# Patient Record
Sex: Female | Born: 1985 | Race: White | Hispanic: No | Marital: Single | Smoking: Never smoker
Health system: Southern US, Community
[De-identification: ages and names within clinical notes are randomized; demographics above are authoritative.]

## PROBLEM LIST (undated history)

## (undated) DIAGNOSIS — F32A Depression, unspecified: Secondary | ICD-10-CM

## (undated) DIAGNOSIS — G43909 Migraine, unspecified, not intractable, without status migrainosus: Secondary | ICD-10-CM

## (undated) DIAGNOSIS — F329 Major depressive disorder, single episode, unspecified: Secondary | ICD-10-CM

## (undated) HISTORY — PX: GALLBLADDER SURGERY: SHX652

## (undated) HISTORY — PX: PLACEMENT OF BREAST IMPLANTS: SHX6334

---
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
## 2008-11-06 ENCOUNTER — Encounter: Payer: Self-pay | Admitting: Endocrinology

## 2008-11-07 ENCOUNTER — Encounter: Payer: Self-pay | Admitting: Endocrinology

## 2008-12-15 ENCOUNTER — Ambulatory Visit: Payer: Self-pay | Admitting: Endocrinology

## 2008-12-15 DIAGNOSIS — R002 Palpitations: Secondary | ICD-10-CM | POA: Insufficient documentation

## 2010-03-31 ENCOUNTER — Ambulatory Visit (HOSPITAL_COMMUNITY): Payer: Self-pay | Admitting: Psychiatry

## 2010-03-31 ENCOUNTER — Ambulatory Visit (HOSPITAL_COMMUNITY): Admission: RE | Admit: 2010-03-31 | Discharge: 2010-03-31 | Payer: Self-pay | Admitting: Psychiatry

## 2010-04-04 ENCOUNTER — Other Ambulatory Visit (HOSPITAL_COMMUNITY): Admission: RE | Admit: 2010-04-04 | Discharge: 2010-04-20 | Payer: Self-pay | Admitting: Psychiatry

## 2010-04-15 ENCOUNTER — Ambulatory Visit: Payer: Self-pay | Admitting: Psychiatry

## 2010-04-22 ENCOUNTER — Ambulatory Visit (HOSPITAL_COMMUNITY): Payer: Self-pay | Admitting: Psychiatry

## 2010-04-29 ENCOUNTER — Ambulatory Visit (HOSPITAL_COMMUNITY): Payer: Self-pay | Admitting: Psychiatry

## 2010-05-06 ENCOUNTER — Ambulatory Visit (HOSPITAL_COMMUNITY): Payer: Self-pay | Admitting: Psychiatry

## 2010-05-20 ENCOUNTER — Ambulatory Visit (HOSPITAL_COMMUNITY): Payer: Self-pay | Admitting: Psychiatry

## 2010-05-27 ENCOUNTER — Ambulatory Visit (HOSPITAL_COMMUNITY): Payer: Self-pay | Admitting: Psychiatry

## 2010-06-03 ENCOUNTER — Ambulatory Visit (HOSPITAL_COMMUNITY): Payer: Self-pay | Admitting: Psychiatry

## 2010-06-10 ENCOUNTER — Ambulatory Visit (HOSPITAL_COMMUNITY): Payer: Self-pay | Admitting: Psychiatry

## 2010-06-24 ENCOUNTER — Ambulatory Visit (HOSPITAL_COMMUNITY): Payer: Self-pay | Admitting: Psychiatry

## 2015-01-25 ENCOUNTER — Encounter: Payer: Self-pay | Admitting: Emergency Medicine

## 2015-01-25 ENCOUNTER — Emergency Department
Admission: EM | Admit: 2015-01-25 | Discharge: 2015-01-25 | Disposition: A | Discharge status: Home / Self Care | Payer: BLUE CROSS/BLUE SHIELD | Attending: Emergency Medicine | Admitting: Emergency Medicine

## 2015-01-25 DIAGNOSIS — G43809 Other migraine, not intractable, without status migrainosus: Secondary | ICD-10-CM

## 2015-01-25 DIAGNOSIS — G43909 Migraine, unspecified, not intractable, without status migrainosus: Secondary | ICD-10-CM | POA: Diagnosis present

## 2015-01-25 HISTORY — DX: Depression, unspecified: F32.A

## 2015-01-25 HISTORY — DX: Migraine, unspecified, not intractable, without status migrainosus: G43.909

## 2015-01-25 HISTORY — DX: Major depressive disorder, single episode, unspecified: F32.9

## 2015-01-25 MED ORDER — KETOROLAC TROMETHAMINE 30 MG/ML IJ SOLN
INTRAMUSCULAR | Status: AC
  Administered 2015-01-25: 30 mg via INTRAVENOUS
  Filled 2015-01-25: qty 1

## 2015-01-25 MED ORDER — KETOROLAC TROMETHAMINE 30 MG/ML IJ SOLN
30.0000 mg | Freq: Once | INTRAMUSCULAR | Status: AC
  Administered 2015-01-25: 30 mg via INTRAVENOUS

## 2015-01-25 MED ORDER — PROMETHAZINE HCL 25 MG PO TABS: 25.0000 mg | ORAL_TABLET | Freq: Once | ORAL | Status: DC

## 2015-01-25 MED ORDER — METOCLOPRAMIDE HCL 5 MG/ML IJ SOLN: 20.0000 mg | Freq: Once | INTRAVENOUS | Status: AC

## 2015-01-25 MED ORDER — METOCLOPRAMIDE HCL 5 MG/ML IJ SOLN
INTRAMUSCULAR | Status: AC
  Administered 2015-01-25: 20 mg
  Filled 2015-01-25: qty 2

## 2015-01-25 MED ORDER — SODIUM CHLORIDE 0.9 % IV SOLN
1000.0000 mL | Freq: Once | INTRAVENOUS | Status: AC
  Administered 2015-01-25: 1000 mL via INTRAVENOUS

## 2015-01-25 MED ORDER — DIPHENHYDRAMINE HCL 50 MG/ML IJ SOLN
25.0000 mg | Freq: Once | INTRAMUSCULAR | Status: AC
  Administered 2015-01-25: 50 mg via INTRAVENOUS

## 2015-01-25 MED ORDER — PROMETHAZINE HCL 25 MG RE SUPP: 25.0000 mg | Freq: Four times a day (QID) | RECTAL | Status: DC | PRN

## 2015-01-25 MED ORDER — DIPHENHYDRAMINE HCL 50 MG/ML IJ SOLN
INTRAMUSCULAR | Status: AC
  Administered 2015-01-25: 50 mg via INTRAVENOUS
  Filled 2015-01-25: qty 1

## 2015-01-25 MED ORDER — BUTALBITAL-APAP-CAFFEINE 50-325-40 MG PO TABS: 1.0000 | ORAL_TABLET | Freq: Four times a day (QID) | ORAL | Status: AC | PRN

## 2015-01-25 NOTE — ED Provider Notes (Signed)
[facility] Emergency Department Provider Note  ____________________________________________  Time seen: 5 PM  I have reviewed the triage vital signs and the nursing notes.   HISTORY  Chief Complaint Migraine      HPI Lynn Alexander is a 29 y.o. female history of migraine headaches who presents with a migraine today. She reports this one has gone on longer than typical as it started 3 days ago. It is typical of her usual migraines with throbbing in her forehead and this one is severe per patient. She has no neuro deficits. She has no fevers chills or neck pain. She has taken Tylenol and other over-the-counter medications without relief.     Past Medical History  Diagnosis Date  . Migraines   . Depression     Patient Active Problem List   Diagnosis Date Noted  . PALPITATIONS 12/15/2008    No past surgical history on file.  No current outpatient prescriptions on file.  Allergies Review of patient's allergies indicates no known allergies.  No family history on file.  Social History History  Substance Use Topics  . Smoking status: Never Smoker   . Smokeless tobacco: Not on file  . Alcohol Use: No    Review of Systems  Constitutional: Negative for fever. Eyes: Negative for visual changes. ENT: Negative for sore throat Cardiovascular: Negative for chest pain. Respiratory: Negative for shortness of breath. Gastrointestinal: Negative for abdominal pain, vomiting and diarrhea. Genitourinary: Negative for dysuria. Musculoskeletal: Negative for back pain. Skin: Negative for rash. Neurological: Positive for headache   10-point ROS otherwise negative.  ____________________________________________   PHYSICAL EXAM:  VITAL SIGNS: ED Triage Vitals  Enc Vitals Group     BP --      Pulse Rate 01/25/15 1551 128     Resp --      Temp 01/25/15 1551 98.2 F (36.8 C)     Temp Source 01/25/15 1551 Oral     SpO2 01/25/15 1551 95 %   Weight 01/25/15 1551 285 lb (129.275 kg)     Height 01/25/15 1551 5\' 5"  (1.651 m)     Head Cir --      Peak Flow --      Pain Score 01/25/15 1551 6     Pain Loc --      Pain Edu? --      Excl. in GC? --      Constitutional: Alert and oriented. Well appearing and in no distress. Eyes: Conjunctivae are normal. PERRL. ENT   Head: Normocephalic and atraumatic.   Nose: No rhinnorhea.   Mouth/Throat: Mucous membranes are moist. Cardiovascular: Tachycardia, regular rhythm. Normal and symmetric distal pulses are present in all extremities. No murmurs, rubs, or gallops. Respiratory: Normal respiratory effort without tachypnea nor retractions. Breath sounds are clear and equal bilaterally.  Gastrointestinal: Soft and non-tender in all quadrants. No distention. There is no CVA tenderness. Genitourinary: deferred Musculoskeletal: Nontender with normal range of motion in all extremities. No lower extremity tenderness nor edema. Neurologic:  Normal speech and language. No gross focal neurologic deficits are appreciated. Skin:  Skin is warm, dry and intact. No rash noted. Psychiatric: Mood and affect are normal. Patient exhibits appropriate insight and judgment.  ____________________________________________    LABS (pertinent positives/negatives)  Labs Reviewed - No data to display  ____________________________________________   EKG  None  ____________________________________________    RADIOLOGY  None  ____________________________________________   PROCEDURES  Procedure(s) performed: none  Critical Care performed: none  ____________________________________________  INITIAL IMPRESSION / ASSESSMENT AND PLAN / ED COURSE  Pertinent labs & imaging results that were available during my care of the patient were reviewed by me and considered in my medical decision making (see chart for details).  Patient well-appearing, neuro intact. No neck pain, no fever, no  rash. No trauma. We will treat with headache cocktail and IV fluids and reevaluate  ____________________________________________ ----------------------------------------- 6:25 PM on 01/25/2015 -----------------------------------------  Patient reports headache has resolved. Her heart rate is improved to 94 bpm. She feels ready for discharge. She requests Phenergan for nausea that she sometimes develops with headaches  FINAL CLINICAL IMPRESSION(S) / ED DIAGNOSES  Final diagnoses:  Other migraine without status migrainosus, not intractable     Jene Every, MD 01/25/15 1826

## 2015-01-25 NOTE — ED Notes (Signed)
Pt c/o migraine headache since Friday am, states she has hx of migraines and usually is able to mange the pain with OTC meds and her chiropractor, states this time she has not been able to relieve the pain, also having some v/d, and dizziness, denies any visual disturbances

## 2015-01-25 NOTE — Discharge Instructions (Signed)

## 2015-01-25 NOTE — ED Notes (Signed)
Sent in from urgent care with migraine started last pm  Positive n/v

## 2019-09-19 ENCOUNTER — Other Ambulatory Visit: Payer: BLUE CROSS/BLUE SHIELD

## 2019-09-25 ENCOUNTER — Inpatient Hospital Stay
Admission: EM | Admit: 2019-09-25 | Discharge: 2019-09-26 | DRG: 177 | Disposition: A | Discharge status: Other Acute Inpatient Hospital | Payer: HRSA Program | Attending: Internal Medicine | Admitting: Internal Medicine

## 2019-09-25 ENCOUNTER — Emergency Department: Payer: HRSA Program

## 2019-09-25 ENCOUNTER — Other Ambulatory Visit: Payer: Self-pay

## 2019-09-25 DIAGNOSIS — J9600 Acute respiratory failure, unspecified whether with hypoxia or hypercapnia: Secondary | ICD-10-CM

## 2019-09-25 DIAGNOSIS — Z79899 Other long term (current) drug therapy: Secondary | ICD-10-CM | POA: Diagnosis not present

## 2019-09-25 DIAGNOSIS — J9601 Acute respiratory failure with hypoxia: Secondary | ICD-10-CM | POA: Diagnosis present

## 2019-09-25 DIAGNOSIS — Z791 Long term (current) use of non-steroidal anti-inflammatories (NSAID): Secondary | ICD-10-CM

## 2019-09-25 DIAGNOSIS — U071 COVID-19: Secondary | ICD-10-CM | POA: Diagnosis present

## 2019-09-25 DIAGNOSIS — F329 Major depressive disorder, single episode, unspecified: Secondary | ICD-10-CM | POA: Diagnosis present

## 2019-09-25 DIAGNOSIS — F32A Depression, unspecified: Secondary | ICD-10-CM | POA: Diagnosis present

## 2019-09-25 DIAGNOSIS — J1282 Pneumonia due to coronavirus disease 2019: Secondary | ICD-10-CM | POA: Diagnosis present

## 2019-09-25 LAB — CBC WITH DIFFERENTIAL/PLATELET
Abs Immature Granulocytes: 0.04 10*3/uL (ref 0.00–0.07)
Basophils Absolute: 0 10*3/uL (ref 0.0–0.1)
Basophils Relative: 0 %
Eosinophils Absolute: 0 10*3/uL (ref 0.0–0.5)
Eosinophils Relative: 0 %
HCT: 41.5 % (ref 36.0–46.0)
Hemoglobin: 13.8 g/dL (ref 12.0–15.0)
Immature Granulocytes: 1 %
Lymphocytes Relative: 14 %
Lymphs Abs: 0.7 10*3/uL (ref 0.7–4.0)
MCH: 30.7 pg (ref 26.0–34.0)
MCHC: 33.3 g/dL (ref 30.0–36.0)
MCV: 92.4 fL (ref 80.0–100.0)
Monocytes Absolute: 0.2 10*3/uL (ref 0.1–1.0)
Monocytes Relative: 4 %
Neutro Abs: 4.4 10*3/uL (ref 1.7–7.7)
Neutrophils Relative %: 81 %
Platelets: 158 10*3/uL (ref 150–400)
RBC: 4.49 MIL/uL (ref 3.87–5.11)
RDW: 12.1 % (ref 11.5–15.5)
WBC: 5.4 10*3/uL (ref 4.0–10.5)
nRBC: 0 % (ref 0.0–0.2)

## 2019-09-25 LAB — RESPIRATORY PANEL BY RT PCR (FLU A&B, COVID)
Influenza A by PCR: NEGATIVE
Influenza B by PCR: NEGATIVE
SARS Coronavirus 2 by RT PCR: POSITIVE — AB

## 2019-09-25 LAB — LACTIC ACID, PLASMA: Lactic Acid, Venous: 1 mmol/L (ref 0.5–1.9)

## 2019-09-25 LAB — COMPREHENSIVE METABOLIC PANEL
ALT: 106 U/L — ABNORMAL HIGH (ref 0–44)
AST: 96 U/L — ABNORMAL HIGH (ref 15–41)
Albumin: 3.5 g/dL (ref 3.5–5.0)
Alkaline Phosphatase: 65 U/L (ref 38–126)
Anion gap: 11 (ref 5–15)
BUN: 10 mg/dL (ref 6–20)
CO2: 25 mmol/L (ref 22–32)
Calcium: 8.5 mg/dL — ABNORMAL LOW (ref 8.9–10.3)
Chloride: 102 mmol/L (ref 98–111)
Creatinine, Ser: 0.73 mg/dL (ref 0.44–1.00)
GFR calc Af Amer: >60 mL/min (ref >60)
GFR calc non Af Amer: >60 mL/min (ref >60)
Glucose, Bld: 119 mg/dL — ABNORMAL HIGH (ref 70–99)
Potassium: 3.7 mmol/L (ref 3.5–5.1)
Sodium: 138 mmol/L (ref 135–145)
Total Bilirubin: 0.8 mg/dL (ref 0.3–1.2)
Total Protein: 7.3 g/dL (ref 6.5–8.1)

## 2019-09-25 LAB — FIBRINOGEN: Fibrinogen: 687 mg/dL — ABNORMAL HIGH (ref 210–475)

## 2019-09-25 LAB — FERRITIN: Ferritin: 338 ng/mL — ABNORMAL HIGH (ref 11–307)

## 2019-09-25 LAB — TRIGLYCERIDES: Triglycerides: 107 mg/dL (ref <150)

## 2019-09-25 LAB — POC SARS CORONAVIRUS 2 AG: SARS Coronavirus 2 Ag: NEGATIVE

## 2019-09-25 LAB — LACTATE DEHYDROGENASE: LDH: 361 U/L — ABNORMAL HIGH (ref 98–192)

## 2019-09-25 LAB — FIBRIN DERIVATIVES D-DIMER (ARMC ONLY): Fibrin derivatives D-dimer (ARMC): 906.98 ng/mL (FEU) — ABNORMAL HIGH (ref 0.00–499.00)

## 2019-09-25 LAB — C-REACTIVE PROTEIN: CRP: 7.1 mg/dL — ABNORMAL HIGH (ref <1.0)

## 2019-09-25 LAB — PROCALCITONIN: Procalcitonin: <0.1 ng/mL

## 2019-09-25 MED ORDER — SODIUM CHLORIDE 0.9 % IV SOLN
100.0000 mg | Freq: Every day | INTRAVENOUS | Status: DC
  Filled 2019-09-25: qty 20

## 2019-09-25 MED ORDER — SODIUM CHLORIDE 0.9 % IV BOLUS
1000.0000 mL | Freq: Once | INTRAVENOUS | Status: AC
  Administered 2019-09-25: 1000 mL via INTRAVENOUS

## 2019-09-25 MED ORDER — DEXAMETHASONE SODIUM PHOSPHATE 10 MG/ML IJ SOLN
10.0000 mg | Freq: Once | INTRAMUSCULAR | Status: AC
  Administered 2019-09-25: 10 mg via INTRAVENOUS
  Filled 2019-09-25: qty 1

## 2019-09-25 MED ORDER — ACETAMINOPHEN 500 MG PO TABS
1000.0000 mg | ORAL_TABLET | Freq: Once | ORAL | Status: AC
  Administered 2019-09-25: 21:00:00 1000 mg via ORAL
  Filled 2019-09-25: qty 2

## 2019-09-25 MED ORDER — SODIUM CHLORIDE 0.9 % IV SOLN
200.0000 mg | Freq: Once | INTRAVENOUS | Status: AC
  Administered 2019-09-25: 200 mg via INTRAVENOUS
  Filled 2019-09-25: qty 200

## 2019-09-25 NOTE — ED Notes (Signed)
Pt able to ambulate to commode with assistance. Pt returned to bed and resting comfortably.

## 2019-09-25 NOTE — ED Triage Notes (Signed)
Pt arrives via EMS from home after testing positive for covid on last Friday- pt had a fever pg 103.5 with taking acetaminophen and ibruprofen alternating- pt RA sats 89%

## 2019-09-25 NOTE — Consult Note (Signed)
Remdesivir - Pharmacy Brief Note   O:  ALT: 106 CXR: Multifocal pneumonia with greatest degree of airspace opacity in the left upper lobe and right base regions. Cardiac silhouette normal. No adenopathy. SpO2: 88% on 4L   A/P:  Remdesivir 200 mg IVPB once followed by 100 mg IVPB daily x 4 days.   Bettey Costa, PharmD Clinical Pharmacist 09/25/2019 6:42 PM

## 2019-09-25 NOTE — Discharge Summary (Signed)
Patient admitted with COVID-19 pneumonia and acute respiratory failure to [facility].  We will transfer to [facility] due to worsening oxygen requirement.  Patient will require dedicated Covid team at this point.  She is young previously healthy.  Please refer to full H&P as dictated with orders.

## 2019-09-25 NOTE — H&P (Signed)
History and Physical   Lynn Alexander BWL:[id number] DOB: Oct 14, 1985 DOA: 09/25/2019  Referring MD/NP/PA: Dr. Scotty Court  PCP: Patsy Lager Ilona Sorrel, PA-C   Outpatient Specialists: None  Patient coming from: Home  Chief Complaint: Shortness of breath  HPI: Lynn Alexander is a 34 y.o. female with medical history significant of morbid obesity, migraine headaches, who was diagnosed with COVID-19 about 6 days ago.  Patient has been having worsening body aches fever chills and malaise.  She also feels some fatigue.  Not sure of how she contracted it.  She came in today due to worsening symptoms and failing outpatient management.  She is having cough and feeling tight in her chest.  Patient noted to be hypoxic on room air.  Chest x-ray showed bilateral pneumonia.  She is being admitted with COVID-19 pneumonia with oxygen requirement.  Patient currently requiring up to 6 L of oxygen.  She is still borderline hypoxic and might require high flow oxygen or OptiFlow..  ED Course: Temperature 103 blood pressure 147/79 pulse 125 respirate of 33 oxygen sat 88% on room air.  Chemistry overall stable except for AST of 96 and ALT of 106.  LDH 361 triglyceride 107 ferritin 338.  CRP of 7.1 lactic acid 1.0 and procalcitonin is negative.  CBC is within normal.  COVID-19 is positive.  Fibrin 906 and fibrinogen 687.  Chest x-ray showed bilateral infiltrates consistent with Covid pneumonia.  Patient initiated on remdesivir, Actemra, antibiotics and oxygen.  She will be transferred to [facility] for treatment.   Review of Systems: As per HPI otherwise 10 point review of systems negative.    Past Medical History:  Diagnosis Date  . Depression   . Migraines     History reviewed. No pertinent surgical history.   reports that she has never smoked. She has never used smokeless tobacco. She reports that she does not drink alcohol or use drugs.  No Known Allergies  No family history on file.   Prior  to Admission medications   Medication Sig Start Date End Date Taking? Authorizing Provider  acetaminophen (TYLENOL) 500 MG tablet Take 500-1,000 mg by mouth every 6 (six) hours as needed for mild pain or fever.   Yes [provider]  ascorbic acid (VITAMIN C) 500 MG tablet Take 500 mg by mouth daily.   Yes [provider]  cholecalciferol (VITAMIN D3) 25 MCG (1000 UNIT) tablet Take 1,000 Units by mouth daily.   Yes [provider]  escitalopram (LEXAPRO) 10 MG tablet Take 10 mg by mouth at bedtime.   Yes [provider]  ibuprofen (ADVIL) 200 MG tablet Take 200-400 mg by mouth every 6 (six) hours as needed for fever or mild pain.   Yes [provider]  mirtazapine (REMERON) 15 MG tablet Take 7.5 mg by mouth See admin instructions. Take  tablet (7.5mg ) by mouth at bedtime and repeat dose if needed overnight   Yes [provider]  ondansetron (ZOFRAN) 8 MG tablet Take 8 mg by mouth every 8 (eight) hours as needed for nausea or vomiting.   Yes [provider]  vitamin B-12 (CYANOCOBALAMIN) 1000 MCG tablet Take 1,000 mcg by mouth daily.   Yes [provider]  zinc sulfate 220 (50 Zn) MG capsule Take 220 mg by mouth daily.   Yes [provider]    Physical Exam: Vitals:   09/25/19 1800 09/25/19 1845 09/25/19 1856 09/25/19 2017  BP: (!) 147/79   135/72  Pulse: (!) 110 (!)  111 (!) 108 (!) 110  Resp: (!) 31   (!) 25  Temp:    (!) 103 F (39.4 C)  TempSrc:    Oral  SpO2: 93% (!) 88% 92% 92%  Weight:      Height:          Constitutional: Anxious, morbidly obese in mild distress Vitals:   09/25/19 1800 09/25/19 1845 09/25/19 1856 09/25/19 2017  BP: (!) 147/79   135/72  Pulse: (!) 110 (!) 111 (!) 108 (!) 110  Resp: (!) 31   (!) 25  Temp:    (!) 103 F (39.4 C)  TempSrc:    Oral  SpO2: 93% (!) 88% 92% 92%  Weight:      Height:       Eyes: PERRL, lids and conjunctivae normal ENMT: Mucous membranes are  moist. Posterior pharynx clear of any exudate or lesions.Normal dentition.  Neck: normal, supple, no masses, no thyromegaly Respiratory: Decreased air entry bilaterally, mild expiratory wheezing, bilateral rhonchi and rails, use of extra muscle respiration.  Cardiovascular: Sinus tachycardia, no murmurs / rubs / gallops. No extremity edema. 2+ pedal pulses. No carotid bruits.  Abdomen: no tenderness, no masses palpated. No hepatosplenomegaly. Bowel sounds positive.  Musculoskeletal: no clubbing / cyanosis. No joint deformity upper and lower extremities. Good ROM, no contractures. Normal muscle tone.  Skin: no rashes, lesions, ulcers. No induration Neurologic: CN 2-12 grossly intact. Sensation intact, DTR normal. Strength 5/5 in all 4.  Psychiatric: Normal judgment and insight. Alert and oriented x 3. Normal mood.     Labs on Admission: I have personally reviewed following labs and imaging studies  CBC: Recent Labs  Lab 09/25/19 1620  WBC 5.4  NEUTROABS 4.4  HGB 13.8  HCT 41.5  MCV 92.4  PLT 158   Basic Metabolic Panel: Recent Labs  Lab 09/25/19 1620  NA 138  K 3.7  CL 102  CO2 25  GLUCOSE 119*  BUN 10  CREATININE 0.73  CALCIUM 8.5*   GFR: Estimated Creatinine Clearance: 143.4 mL/min (by C-G formula based on SCr of 0.73 mg/dL). Liver Function Tests: Recent Labs  Lab 09/25/19 1620  AST 96*  ALT 106*  ALKPHOS 65  BILITOT 0.8  PROT 7.3  ALBUMIN 3.5   No results for input(s): LIPASE, AMYLASE in the last 168 hours. No results for input(s): AMMONIA in the last 168 hours. Coagulation Profile: No results for input(s): INR, PROTIME in the last 168 hours. Cardiac Enzymes: No results for input(s): CKTOTAL, CKMB, CKMBINDEX, TROPONINI in the last 168 hours. BNP (last 3 results) No results for input(s): PROBNP in the last 8760 hours. HbA1C: No results for input(s): HGBA1C in the last 72 hours. CBG: No results for input(s): GLUCAP in the last 168 hours. Lipid  Profile: Recent Labs    09/25/19 1620  TRIG 107   Thyroid Function Tests: No results for input(s): TSH, T4TOTAL, FREET4, T3FREE, THYROIDAB in the last 72 hours. Anemia Panel: Recent Labs    09/25/19 1620  FERRITIN 338*   Urine analysis: No results found for: COLORURINE, APPEARANCEUR, LABSPEC, PHURINE, GLUCOSEU, HGBUR, BILIRUBINUR, KETONESUR, PROTEINUR, UROBILINOGEN, NITRITE, LEUKOCYTESUR Sepsis Labs: @LABRCNTIP (procalcitonin:4,lacticidven:4) ) Recent Results (from the past 240 hour(s))  Respiratory Panel by RT PCR (Flu A&B, Covid) - Nasopharyngeal Swab     Status: Abnormal   Collection Time: 09/25/19  5:24 PM   Specimen: Nasopharyngeal Swab  Result Value Ref Range Status   SARS Coronavirus 2 by RT PCR POSITIVE (A) NEGATIVE Final  Comment: RESULT CALLED TO, READ BACK BY AND VERIFIED WITH: ARIEL SMITH @1823  09/25/19 MJU (NOTE) SARS-CoV-2 target nucleic acids are DETECTED. SARS-CoV-2 RNA is generally detectable in upper respiratory specimens  during the acute phase of infection. Positive results are indicative of the presence of the identified virus, but do not rule out bacterial infection or co-infection with other pathogens not detected by the test. Clinical correlation with patient history and other diagnostic information is necessary to determine patient infection status. The expected result is Negative. Fact Sheet for Patients:  11/23/19 Fact Sheet for Healthcare Providers: https://www.moore.com/ This test is not yet approved or cleared by the https://www.young.biz/ FDA and  has been authorized for detection and/or diagnosis of SARS-CoV-2 by FDA under an Emergency Use Authorization (EUA).  This EUA will remain in effect (meaning this test can be used) for the  duration of  the COVID-19 declaration under Section 564(b)(1) of the Act, 21 U.S.C. section 360bbb-3(b)(1), unless the authorization is terminated or revoked sooner.     Influenza A by PCR NEGATIVE NEGATIVE Final   Influenza B by PCR NEGATIVE NEGATIVE Final    Comment: (NOTE) The Xpert Xpress SARS-CoV-2/FLU/RSV assay is intended as an aid in  the diagnosis of influenza from Nasopharyngeal swab specimens and  should not be used as a sole basis for treatment. Nasal washings and  aspirates are unacceptable for Xpert Xpress SARS-CoV-2/FLU/RSV  testing. Fact Sheet for Patients: [place] Fact Sheet for Healthcare Providers: https://www.moore.com/ This test is not yet approved or cleared by the https://www.young.biz/ FDA and  has been authorized for detection and/or diagnosis of SARS-CoV-2 by  FDA under an Emergency Use Authorization (EUA). This EUA will remain  in effect (meaning this test can be used) for the duration of the  Covid-19 declaration under Section 564(b)(1) of the Act, 21  U.S.C. section 360bbb-3(b)(1), unless the authorization is  terminated or revoked. Performed at [facility], [address], [place], [place]      Radiological Exams on Admission: DG Chest [facility] 1 View  Result Date: 09/25/2019 CLINICAL DATA:  COVID-19 positive with hypoxia EXAM: PORTABLE CHEST 1 VIEW COMPARISON:  None. FINDINGS: There is airspace opacity consistent with pneumonia in the left upper lobe, right base, and to a lesser extent in the right upper lobe. Heart size and pulmonary vascularity are normal. No adenopathy. No bone lesions. IMPRESSION: Multifocal pneumonia with greatest degree of airspace opacity in the left upper lobe and right base regions. Cardiac silhouette normal. No adenopathy. Electronically Signed   By: 11/23/2019 III M.D.   On: 09/25/2019 16:34    EKG: Independently reviewed. Shows Sinus tachycardia with borderline prolonged QT interval  Assessment/Plan Principal Problem:   Acute respiratory failure due to COVID-19 [facility]) Active Problems:   Depression     #1 Acute  Respiratory failure with Hypoxia: Due to Covid 19 pneumonia.  Patient has high oxygen demand.  Will initiate full treatment for COVID-19 pneumonia.  Titrate oxygen as tolerated.  #2 COVID-19 pneumonia: Initiate remdesivir, IV azithromycin.  Actemra, dexamethasone and Lovenox for prophylaxis.  Follow daily markers.  Transferred to [facility] for full treatment.  #3 morbid obesity: Dietary counseling  #4 depression: Resume home regimen.  Patient appears pleasant at this point  #5 history of migraine headaches: Asymptomatic at the moment.   DVT prophylaxis: Lovenox Code Status: Full code Family Communication: No family at bedside Disposition Plan: To be determined Consults called: None Admission status: Inpatient  Severity of Illness: The  appropriate patient status for this patient is INPATIENT. Inpatient status is judged to be reasonable and necessary in order to provide the required intensity of service to ensure the patient's safety. The patient's presenting symptoms, physical exam findings, and initial radiographic and laboratory data in the context of their chronic comorbidities is felt to place them at high risk for further clinical deterioration. Furthermore, it is not anticipated that the patient will be medically stable for discharge from the hospital within 2 midnights of admission. The following factors support the patient status of inpatient.   " The patient's presenting symptoms include shortness of breath and cough. " The worrisome physical exam findings include fever with decreased air entry. " The initial radiographic and laboratory data are worrisome because of positive COVID-19 with bilateral infiltrates. " The chronic co-morbidities include none.   * I certify that at the point of admission it is my clinical judgment that the patient will require inpatient hospital care spanning beyond 2 midnights from the point of admission due to high intensity of service, high  risk for further deterioration and high frequency of surveillance required.Barbette Merino MD [facility] Pager [phone]  If 7PM-7AM, please contact night-coverage [email or web address] Password [facility]  09/25/2019, 8:29 PM

## 2019-09-25 NOTE — ED Notes (Signed)
Pt states last use of medication was 2 ibuprofen about 1 hour ago

## 2019-09-25 NOTE — ED Notes (Signed)
After pt ambulated to toilet she desatted to 88%- pt O2 upped to 6L [place] and is now 92%

## 2019-09-25 NOTE — ED Notes (Signed)
Bill, RN attempted a second IV and cultures x2 with [place] with no success- Dr Scotty Court aware and okay with only 1 set of blood cultures at this time

## 2019-09-25 NOTE — ED Notes (Signed)
Pt assisted to use bathroom.

## 2019-09-25 NOTE — ED Provider Notes (Signed)
[facility] Emergency Department Provider Note  ____________________________________________  Time seen: Approximately 6:39 PM  I have reviewed the triage vital signs and the nursing notes.   HISTORY  Chief Complaint Shortness of Breath    HPI Lynn Alexander is a 34 y.o. female with a history of migraines who reports having a positive Covid test 6 days ago who complains of worsening body aches fevers chills malaise fatigue and shortness of breath, gradual onset continuous and worsening for the past 9 days.  No aggravating or alleviating factors.  Has a nonproductive cough as well.  Shortness of breath is severe.  Denies chest pain.    Past Medical History:  Diagnosis Date  . Depression   . Migraines      Patient Active Problem List   Diagnosis Date Noted  . PALPITATIONS 12/15/2008     History reviewed. No pertinent surgical history.   Prior to Admission medications   Medication Sig Start Date End Date Taking? Authorizing Provider  acetaminophen (TYLENOL) 500 MG tablet Take 500-1,000 mg by mouth every 6 (six) hours as needed for mild pain or fever.   Yes [provider]  ascorbic acid (VITAMIN C) 500 MG tablet Take 500 mg by mouth daily.   Yes [provider]  cholecalciferol (VITAMIN D3) 25 MCG (1000 UNIT) tablet Take 1,000 Units by mouth daily.   Yes [provider]  escitalopram (LEXAPRO) 10 MG tablet Take 10 mg by mouth at bedtime.   Yes [provider]  ibuprofen (ADVIL) 200 MG tablet Take 200-400 mg by mouth every 6 (six) hours as needed for fever or mild pain.   Yes [provider]  mirtazapine (REMERON) 15 MG tablet Take 7.5 mg by mouth See admin instructions. Take  tablet (7.5mg ) by mouth at bedtime and repeat dose if needed overnight   Yes [provider]  ondansetron (ZOFRAN) 8 MG tablet Take 8 mg by mouth every 8 (eight) hours as needed for nausea or vomiting.   Yes [provider]  vitamin B-12 (CYANOCOBALAMIN) 1000 MCG tablet Take 1,000 mcg by mouth daily.   Yes [provider]  zinc sulfate 220 (50 Zn) MG capsule Take 220 mg by mouth daily.   Yes [provider]     Allergies Patient has no known allergies.   No family history on file.  Social History Social History   Tobacco Use  . Smoking status: Never Smoker  . Smokeless tobacco: Never Used  Substance Use Topics  . Alcohol use: No  . Drug use: No    Review of Systems  Constitutional: Positive fever and chills.  ENT:   No sore throat. No rhinorrhea. Cardiovascular:   No chest pain or syncope. Respiratory: Positive shortness of breath and cough. Gastrointestinal:   Negative for abdominal pain, vomiting and diarrhea.  Musculoskeletal:   Negative for focal pain or swelling.  Positive body aches All other systems reviewed and are negative except as documented above in ROS and HPI.  ____________________________________________   PHYSICAL EXAM:  VITAL SIGNS: ED Triage Vitals  Enc Vitals Group     BP 09/25/19 1617 120/72     Pulse Rate 09/25/19 1612 (!) 125     Resp 09/25/19 1612 (!) 24     Temp 09/25/19 1617 (!) 101.9 F (38.8 C)     Temp Source 09/25/19 1617 Oral     SpO2 09/25/19 1612 (!) 89 %     Weight 09/25/19 1613 (!) 315 lb (  142.9 kg)     Height 09/25/19 1613 5\' 4"  (1.626 m)     Head Circumference --      Peak Flow --      Pain Score 09/25/19 1613 0     Pain Loc --      Pain Edu? --      Excl. in [place]? --     Vital signs reviewed, nursing assessments reviewed.   Constitutional:   Alert and oriented. Non-toxic appearance. Eyes:   Conjunctivae are normal. EOMI. PERRL. ENT      Head:   Normocephalic and atraumatic.      Nose: Normal.      Mouth/Throat: Dry mucous membranes      Neck:   No meningismus. Full ROM. Hematological/Lymphatic/Immunilogical:   No cervical lymphadenopathy. Cardiovascular:   Tachycardia heart rate 115. Symmetric  bilateral radial and DP pulses.  No murmurs. Cap refill less than 2 seconds. Respiratory:   Tachypnea, no accessory muscle use.  Symmetric air entry in all lung fields.  Diffuse crackles. Gastrointestinal:   Soft and nontender. Non distended. There is no CVA tenderness.  No rebound, rigidity, or guarding. Musculoskeletal:   Normal range of motion in all extremities. No joint effusions.  No lower extremity tenderness.  No edema. Neurologic:   Normal speech and language.  Motor grossly intact. No acute focal neurologic deficits are appreciated.  Skin:    Skin is warm, dry and intact. No rash noted.  No petechiae, purpura, or bullae.  ____________________________________________    LABS (pertinent positives/negatives) (all labs ordered are listed, but only abnormal results are displayed) Labs Reviewed  RESPIRATORY PANEL BY RT PCR (FLU A&B, COVID) - Abnormal; Notable for the following components:      Result Value   SARS Coronavirus 2 by RT PCR POSITIVE (*)    All other components within normal limits  COMPREHENSIVE METABOLIC PANEL - Abnormal; Notable for the following components:   Glucose, Bld 119 (*)    Calcium 8.5 (*)    AST 96 (*)    ALT 106 (*)    All other components within normal limits  FIBRIN DERIVATIVES D-DIMER ([facility] ONLY) - Abnormal; Notable for the following components:   Fibrin derivatives D-dimer ([facility]) 906.98 (*)    All other components within normal limits  LACTATE DEHYDROGENASE - Abnormal; Notable for the following components:   LDH 361 (*)    All other components within normal limits  FERRITIN - Abnormal; Notable for the following components:   Ferritin 338 (*)    All other components within normal limits  FIBRINOGEN - Abnormal; Notable for the following components:   Fibrinogen 687 (*)    All other components within normal limits  CULTURE, BLOOD (ROUTINE X 2)  CULTURE, BLOOD (ROUTINE X 2)  LACTIC ACID, PLASMA  CBC WITH DIFFERENTIAL/PLATELET  PROCALCITONIN   TRIGLYCERIDES  LACTIC ACID, PLASMA  C-REACTIVE PROTEIN  POC SARS CORONAVIRUS 2 AG -  ED  POC SARS CORONAVIRUS 2 AG  POC URINE PREG, ED   ____________________________________________   EKG  Interpreted by me Sinus tachycardia rate 122.  Normal axis and intervals.  Normal QRS ST segments and T waves.  ____________________________________________    [id number]  DG Chest Port 1 View  Result Date: 09/25/2019 CLINICAL DATA:  COVID-19 positive with hypoxia EXAM: PORTABLE CHEST 1 VIEW COMPARISON:  None. FINDINGS: There is airspace opacity consistent with pneumonia in the left upper lobe, right base, and to a lesser extent in the right upper lobe. Heart size  and pulmonary vascularity are normal. No adenopathy. No bone lesions. IMPRESSION: Multifocal pneumonia with greatest degree of airspace opacity in the left upper lobe and right base regions. Cardiac silhouette normal. No adenopathy. Electronically Signed   By: Bretta Bang III M.D.   On: 09/25/2019 16:34    ____________________________________________   PROCEDURES .Critical Care Performed by: Sharman Cheek, MD Authorized by: Sharman Cheek, MD   Critical care provider statement:    Critical care time (minutes):  35   Critical care time was exclusive of:  Separately billable procedures and treating other patients   Critical care was necessary to treat or prevent imminent or life-threatening deterioration of the following conditions:  Respiratory failure   Critical care was time spent personally by me on the following activities:  Development of treatment plan with patient or surrogate, discussions with consultants, evaluation of patient's response to treatment, examination of patient, obtaining history from patient or surrogate, ordering and performing treatments and interventions, ordering and review of laboratory studies, ordering and review of radiographic studies, pulse oximetry, re-evaluation of patient's condition  and review of old charts    ____________________________________________    CLINICAL IMPRESSION / ASSESSMENT AND PLAN / ED COURSE  Medications ordered in the ED: Medications  dexamethasone (DECADRON) injection 10 mg (has no administration in time range)  sodium chloride 0.9 % bolus 1,000 mL (0 mLs Intravenous Stopped 09/25/19 1819)    Pertinent labs & imaging results that were available during my care of the patient were reviewed by me and considered in my medical decision making (see chart for details).  Adreana L Fodge was evaluated in Emergency Department on 09/25/2019 for the symptoms described in the history of present illness. She was evaluated in the context of the global COVID-19 pandemic, which necessitated consideration that the patient might be at risk for infection with the SARS-CoV-2 virus that causes COVID-19. Institutional protocols and algorithms that pertain to the evaluation of patients at risk for COVID-19 are in a state of rapid change based on information released by regulatory bodies including the CDC and federal and state organizations. These policies and algorithms were followed during the patient's care in the ED.   Patient with reported recent positive Covid test presents with tachycardia tachypnea hypoxia, consistent with worsening COVID-19 pneumonia/pneumonitis.  Given this diagnosis, I doubt bacterial pneumonia or sepsis.  Will initiate sepsis lab panel, withhold antibiotics pending further work-up.  Nasal cannula oxygen as needed.  Clinical Course as of Sep 24 1837  Thu Sep 25, 2019  1713 Chest x-ray image and radiology report reviewed by me, shows multifocal infiltrates consistent with COVID-19 pneumonia.   [PS]  1727 Nurse reports  they are only able to obtain one set of blood culture due to difficulty obtaining iv access and blood return from IV site   [PS]    Clinical Course User Index [PS] Sharman Cheek, MD      ----------------------------------------- 6:41 PM on 09/25/2019 -----------------------------------------  Sepsis work-up reassuring, procalcitonin negative, lactate normal, blood pressure remains normal.  Covid PCR positive.  Will cancel code sepsis.  Hospitalist to admit.  Decadron and remdesivir ordered for Covid pneumonia.  Oxygenation stabilized on 3 L nasal cannula.  ____________________________________________   FINAL CLINICAL IMPRESSION(S) / ED DIAGNOSES    Final diagnoses:  Pneumonia due to COVID-19 virus  Acute respiratory failure with hypoxia [facility])     ED Discharge Orders    None      Portions of this note were generated with dragon dictation software.  Dictation errors may occur despite best attempts at proofreading.   Sharman Cheek, MD 09/25/19 [id number]

## 2019-09-26 ENCOUNTER — Inpatient Hospital Stay (HOSPITAL_COMMUNITY)
Admission: AD | Admit: 2019-09-26 | Discharge: 2019-10-02 | DRG: 177 | Disposition: A | Discharge status: Home / Self Care | Payer: HRSA Program | Source: Other Acute Inpatient Hospital | Attending: Internal Medicine | Admitting: Internal Medicine

## 2019-09-26 ENCOUNTER — Encounter (HOSPITAL_COMMUNITY): Payer: Self-pay | Admitting: Family Medicine

## 2019-09-26 DIAGNOSIS — R339 Retention of urine, unspecified: Secondary | ICD-10-CM | POA: Diagnosis not present

## 2019-09-26 DIAGNOSIS — J1282 Pneumonia due to coronavirus disease 2019: Secondary | ICD-10-CM | POA: Diagnosis present

## 2019-09-26 DIAGNOSIS — Z79899 Other long term (current) drug therapy: Secondary | ICD-10-CM | POA: Diagnosis not present

## 2019-09-26 DIAGNOSIS — Z6841 Body Mass Index (BMI) 40.0 and over, adult: Secondary | ICD-10-CM | POA: Diagnosis not present

## 2019-09-26 DIAGNOSIS — R000 Tachycardia, unspecified: Secondary | ICD-10-CM

## 2019-09-26 DIAGNOSIS — F419 Anxiety disorder, unspecified: Secondary | ICD-10-CM | POA: Diagnosis present

## 2019-09-26 DIAGNOSIS — R7401 Elevation of levels of liver transaminase levels: Secondary | ICD-10-CM | POA: Diagnosis present

## 2019-09-26 DIAGNOSIS — F32A Depression, unspecified: Secondary | ICD-10-CM | POA: Diagnosis present

## 2019-09-26 DIAGNOSIS — R32 Unspecified urinary incontinence: Secondary | ICD-10-CM | POA: Diagnosis present

## 2019-09-26 DIAGNOSIS — F329 Major depressive disorder, single episode, unspecified: Secondary | ICD-10-CM | POA: Diagnosis present

## 2019-09-26 DIAGNOSIS — U071 COVID-19: Secondary | ICD-10-CM | POA: Diagnosis present

## 2019-09-26 DIAGNOSIS — J189 Pneumonia, unspecified organism: Secondary | ICD-10-CM

## 2019-09-26 DIAGNOSIS — E662 Morbid (severe) obesity with alveolar hypoventilation: Secondary | ICD-10-CM | POA: Diagnosis present

## 2019-09-26 DIAGNOSIS — J9601 Acute respiratory failure with hypoxia: Secondary | ICD-10-CM | POA: Diagnosis present

## 2019-09-26 LAB — COMPREHENSIVE METABOLIC PANEL
ALT: 89 U/L — ABNORMAL HIGH (ref 0–44)
AST: 67 U/L — ABNORMAL HIGH (ref 15–41)
Albumin: 3.2 g/dL — ABNORMAL LOW (ref 3.5–5.0)
Alkaline Phosphatase: 64 U/L (ref 38–126)
Anion gap: 11 (ref 5–15)
BUN: 11 mg/dL (ref 6–20)
CO2: 23 mmol/L (ref 22–32)
Calcium: 8.3 mg/dL — ABNORMAL LOW (ref 8.9–10.3)
Chloride: 105 mmol/L (ref 98–111)
Creatinine, Ser: 0.69 mg/dL (ref 0.44–1.00)
GFR calc Af Amer: >60 mL/min (ref >60)
GFR calc non Af Amer: >60 mL/min (ref >60)
Glucose, Bld: 111 mg/dL — ABNORMAL HIGH (ref 70–99)
Potassium: 3.8 mmol/L (ref 3.5–5.1)
Sodium: 139 mmol/L (ref 135–145)
Total Bilirubin: 0.7 mg/dL (ref 0.3–1.2)
Total Protein: 7 g/dL (ref 6.5–8.1)

## 2019-09-26 LAB — CBC WITH DIFFERENTIAL/PLATELET
Abs Immature Granulocytes: 0.02 10*3/uL (ref 0.00–0.07)
Basophils Absolute: 0 10*3/uL (ref 0.0–0.1)
Basophils Relative: 0 %
Eosinophils Absolute: 0 10*3/uL (ref 0.0–0.5)
Eosinophils Relative: 0 %
HCT: 41.5 % (ref 36.0–46.0)
Hemoglobin: 13.6 g/dL (ref 12.0–15.0)
Immature Granulocytes: 0 %
Lymphocytes Relative: 18 %
Lymphs Abs: 1 10*3/uL (ref 0.7–4.0)
MCH: 30.8 pg (ref 26.0–34.0)
MCHC: 32.8 g/dL (ref 30.0–36.0)
MCV: 93.9 fL (ref 80.0–100.0)
Monocytes Absolute: 0.3 10*3/uL (ref 0.1–1.0)
Monocytes Relative: 5 %
Neutro Abs: 4.2 10*3/uL (ref 1.7–7.7)
Neutrophils Relative %: 77 %
Platelets: 167 10*3/uL (ref 150–400)
RBC: 4.42 MIL/uL (ref 3.87–5.11)
RDW: 12.3 % (ref 11.5–15.5)
WBC: 5.6 10*3/uL (ref 4.0–10.5)
nRBC: 0 % (ref 0.0–0.2)

## 2019-09-26 LAB — LACTATE DEHYDROGENASE: LDH: 393 U/L — ABNORMAL HIGH (ref 98–192)

## 2019-09-26 LAB — ABO/RH: ABO/RH(D): A POS

## 2019-09-26 LAB — D-DIMER, QUANTITATIVE: D-Dimer, Quant: 0.78 ug/mL-FEU — ABNORMAL HIGH (ref 0.00–0.50)

## 2019-09-26 LAB — FERRITIN: Ferritin: 351 ng/mL — ABNORMAL HIGH (ref 11–307)

## 2019-09-26 LAB — PROCALCITONIN: Procalcitonin: <0.1 ng/mL

## 2019-09-26 MED ORDER — ONDANSETRON HCL 4 MG/2ML IJ SOLN: 4.0000 mg | Freq: Four times a day (QID) | INTRAMUSCULAR | Status: DC | PRN

## 2019-09-26 MED ORDER — SODIUM CHLORIDE 0.9% FLUSH: 3.0000 mL | INTRAVENOUS | Status: DC | PRN

## 2019-09-26 MED ORDER — SODIUM CHLORIDE 0.9 % IV SOLN: 250.0000 mL | INTRAVENOUS | Status: DC | PRN

## 2019-09-26 MED ORDER — DEXAMETHASONE SODIUM PHOSPHATE 10 MG/ML IJ SOLN
6.0000 mg | INTRAMUSCULAR | Status: DC
  Administered 2019-09-26 – 2019-09-29 (×4): 6 mg via INTRAVENOUS
  Filled 2019-09-26 (×4): qty 1

## 2019-09-26 MED ORDER — HYDROCOD POLST-CPM POLST ER 10-8 MG/5ML PO SUER
5.0000 mL | Freq: Two times a day (BID) | ORAL | Status: DC | PRN
  Administered 2019-09-26: 5 mL via ORAL
  Filled 2019-09-26: qty 5

## 2019-09-26 MED ORDER — ONDANSETRON HCL 4 MG PO TABS: 4.0000 mg | ORAL_TABLET | Freq: Four times a day (QID) | ORAL | Status: DC | PRN

## 2019-09-26 MED ORDER — ESCITALOPRAM OXALATE 10 MG PO TABS
10.0000 mg | ORAL_TABLET | Freq: Every day | ORAL | Status: DC
  Administered 2019-09-26 – 2019-10-01 (×6): 10 mg via ORAL
  Filled 2019-09-26 (×7): qty 1

## 2019-09-26 MED ORDER — ENOXAPARIN SODIUM 40 MG/0.4ML ~~LOC~~ SOLN: 40.0000 mg | SUBCUTANEOUS | Status: DC

## 2019-09-26 MED ORDER — GUAIFENESIN-DM 100-10 MG/5ML PO SYRP
10.0000 mL | ORAL_SOLUTION | ORAL | Status: DC | PRN
  Administered 2019-09-27 – 2019-09-28 (×3): 10 mL via ORAL
  Filled 2019-09-26 (×3): qty 10

## 2019-09-26 MED ORDER — SODIUM CHLORIDE 0.9% FLUSH
3.0000 mL | Freq: Two times a day (BID) | INTRAVENOUS | Status: DC
  Administered 2019-09-27 – 2019-10-02 (×9): 3 mL via INTRAVENOUS

## 2019-09-26 MED ORDER — SODIUM CHLORIDE 0.9% FLUSH
3.0000 mL | Freq: Two times a day (BID) | INTRAVENOUS | Status: DC
  Administered 2019-09-26 – 2019-10-02 (×12): 3 mL via INTRAVENOUS

## 2019-09-26 MED ORDER — MIRTAZAPINE 15 MG PO TABS
7.5000 mg | ORAL_TABLET | Freq: Every evening | ORAL | Status: DC | PRN
  Administered 2019-09-26 – 2019-10-01 (×7): 7.5 mg via ORAL
  Filled 2019-09-26 (×3): qty 1
  Filled 2019-09-26: qty 0.5
  Filled 2019-09-26 (×3): qty 1

## 2019-09-26 MED ORDER — SODIUM CHLORIDE 0.9 % IV BOLUS
250.0000 mL | Freq: Once | INTRAVENOUS | Status: AC
  Administered 2019-09-26: 07:00:00 250 mL via INTRAVENOUS

## 2019-09-26 MED ORDER — ENOXAPARIN SODIUM 80 MG/0.8ML ~~LOC~~ SOLN
70.0000 mg | SUBCUTANEOUS | Status: DC
  Administered 2019-09-26 – 2019-10-02 (×7): 70 mg via SUBCUTANEOUS
  Filled 2019-09-26 (×7): qty 0.8

## 2019-09-26 MED ORDER — ACETAMINOPHEN 325 MG PO TABS
650.0000 mg | ORAL_TABLET | Freq: Four times a day (QID) | ORAL | Status: DC | PRN
  Administered 2019-09-26 – 2019-09-27 (×4): 650 mg via ORAL
  Filled 2019-09-26 (×5): qty 2

## 2019-09-26 MED ORDER — SODIUM CHLORIDE 0.9 % IV SOLN
100.0000 mg | Freq: Every day | INTRAVENOUS | Status: AC
  Administered 2019-09-26 – 2019-09-29 (×4): 100 mg via INTRAVENOUS
  Filled 2019-09-26 (×4): qty 20

## 2019-09-26 MED ORDER — HYDROCODONE-ACETAMINOPHEN 5-325 MG PO TABS
1.0000 | ORAL_TABLET | ORAL | Status: DC | PRN
  Administered 2019-09-29 – 2019-09-30 (×2): 1 via ORAL
  Filled 2019-09-26 (×2): qty 1

## 2019-09-26 MED ORDER — POLYETHYLENE GLYCOL 3350 17 G PO PACK: 17.0000 g | PACK | Freq: Every day | ORAL | Status: DC | PRN

## 2019-09-26 NOTE — Progress Notes (Signed)
Spoke with her sister, Cathlean Cower, and gave her an update on patients status and POC. All questions answered at this time.

## 2019-09-26 NOTE — Progress Notes (Signed)
Patients sister and point of contact called.  This nurse answered all questions sister had.

## 2019-09-26 NOTE — H&P (Signed)
History and Physical    Lynn Alexander XBD:[id number] DOB: June 27, 1986 DOA: 09/26/2019  PCP: Rica Records, PA-C   Patient coming from: Home   Chief Complaint: Fever, chills, cough, fatigue, SOB   HPI: Lynn Alexander is a 34 y.o. female with medical history significant for depression and obesity (BMI 54), now presenting to the ED with ~10 days of fever, chills, fatigue, nausea, non-bloody vomiting, nonproductive cough, and progressive SOB. She has been using APAP and ibuprofen at home, monitoring oxygen saturations, noted saturations in upper 80's on 09/25/19, and presented to [facility] ED. She denies abdominal pain or diarrhea but has continued to vomit each night. She has some non-pleuritic chest discomfort, no calf tenderness or hemoptysis, no OCP use or smoking, and no personal or family history of DVT or PE.   [facility] ED Course: Upon arrival to the ED, patient is found to be febrile, tachypneic, tachycardic, saturating in 80's on room air, and with stable BP. COVID pcr was positive, CXR concerning for bilateral infiltrates, EKG with sinus tachycardia, CBC unremarkable, chemistry panel with transaminases in 100-range, CRP 7.1, procalcitonin <0.10, lactate 1.0, and fibrin derivatives 907. Blood culture was collected and she was treated with supplemental O2 via HFNC, 1 liter of NS, Decadron, and remdesivir. She was admitted to the hospitalist service and transferred to [facility] for ongoing evaluation and management.   Review of Systems:  All other systems reviewed and apart from HPI, are negative.  Past Medical History:  Diagnosis Date  . Depression   . Migraines     History reviewed. No pertinent surgical history.   reports that she has never smoked. She has never used smokeless tobacco. She reports that she does not drink alcohol or use drugs.  No Known Allergies  Family History  Problem Relation Age of Onset  . Diabetes Neg Hx   . Hypertension Neg Hx      Prior to  Admission medications   Medication Sig Start Date End Date Taking? Authorizing Provider  acetaminophen (TYLENOL) 500 MG tablet Take 500-1,000 mg by mouth every 6 (six) hours as needed for mild pain or fever.    [provider]  ascorbic acid (VITAMIN C) 500 MG tablet Take 500 mg by mouth daily.    [provider]  cholecalciferol (VITAMIN D3) 25 MCG (1000 UNIT) tablet Take 1,000 Units by mouth daily.    [provider]  escitalopram (LEXAPRO) 10 MG tablet Take 10 mg by mouth at bedtime.    [provider]  ibuprofen (ADVIL) 200 MG tablet Take 200-400 mg by mouth every 6 (six) hours as needed for fever or mild pain.    [provider]  mirtazapine (REMERON) 15 MG tablet Take 7.5 mg by mouth See admin instructions. Take  tablet (7.5mg ) by mouth at bedtime and repeat dose if needed overnight    [provider]  ondansetron (ZOFRAN) 8 MG tablet Take 8 mg by mouth every 8 (eight) hours as needed for nausea or vomiting.    [provider]  vitamin B-12 (CYANOCOBALAMIN) 1000 MCG tablet Take 1,000 mcg by mouth daily.    [provider]  zinc sulfate 220 (50 Zn) MG capsule Take 220 mg by mouth daily.    [provider]    Physical Exam: Vitals:   09/26/19 0500  BP: (!) 142/71  Pulse: (!) 114  Resp: (!) 32  Temp: 98.6 F (37 C)  TempSrc: Oral  SpO2: 92%  Weight: (!) 142.9  kg  Height: 5\' 4"  (1.626 m)    Constitutional: Tacypneic, calm, no pallor or diaphoresis  Eyes: PERTLA, lids and conjunctivae normal ENMT: Mucous membranes are moist. Posterior pharynx clear of any exudate or lesions.   Neck: normal, supple, no masses, no thyromegaly Respiratory: Tachypneic, dyspneic with speech, no wheezing. No pallor or cyanosis.  Cardiovascular: Rate ~120 and regular. No extremity edema.   Abdomen: No distension, no tenderness, soft. Bowel sounds active.  Musculoskeletal: no clubbing / cyanosis. No joint deformity upper and  lower extremities.   Skin: no significant rashes, lesions, ulcers. Warm, dry, well-perfused. Neurologic: No facial asymmetry. Sensation intact. Moving all extremities.  Psychiatric: Alert and oriented, appropriate throughout interview and exam. Pleasant and cooperative.    Labs and Imaging on Admission: I have personally reviewed following labs and imaging studies  CBC: Recent Labs  Lab 09/25/19 1620  WBC 5.4  NEUTROABS 4.4  HGB 13.8  HCT 41.5  MCV 92.4  PLT 158   Basic Metabolic Panel: Recent Labs  Lab 09/25/19 1620  NA 138  K 3.7  CL 102  CO2 25  GLUCOSE 119*  BUN 10  CREATININE 0.73  CALCIUM 8.5*   GFR: Estimated Creatinine Clearance: 143.4 mL/min (by C-G formula based on SCr of 0.73 mg/dL). Liver Function Tests: Recent Labs  Lab 09/25/19 1620  AST 96*  ALT 106*  ALKPHOS 65  BILITOT 0.8  PROT 7.3  ALBUMIN 3.5   No results for input(s): LIPASE, AMYLASE in the last 168 hours. No results for input(s): AMMONIA in the last 168 hours. Coagulation Profile: No results for input(s): INR, PROTIME in the last 168 hours. Cardiac Enzymes: No results for input(s): CKTOTAL, CKMB, CKMBINDEX, TROPONINI in the last 168 hours. BNP (last 3 results) No results for input(s): PROBNP in the last 8760 hours. HbA1C: No results for input(s): HGBA1C in the last 72 hours. CBG: No results for input(s): GLUCAP in the last 168 hours. Lipid Profile: Recent Labs    09/25/19 1620  TRIG 107   Thyroid Function Tests: No results for input(s): TSH, T4TOTAL, FREET4, T3FREE, THYROIDAB in the last 72 hours. Anemia Panel: Recent Labs    09/25/19 1620  FERRITIN 338*   Urine analysis: No results found for: COLORURINE, APPEARANCEUR, LABSPEC, PHURINE, GLUCOSEU, HGBUR, BILIRUBINUR, KETONESUR, PROTEINUR, UROBILINOGEN, NITRITE, LEUKOCYTESUR Sepsis Labs: @LABRCNTIP (procalcitonin:4,lacticidven:4) ) Recent Results (from the past 240 hour(s))  Respiratory Panel by RT PCR (Flu A&B, Covid) -  Nasopharyngeal Swab     Status: Abnormal   Collection Time: 09/25/19  5:24 PM   Specimen: Nasopharyngeal Swab  Result Value Ref Range Status   SARS Coronavirus 2 by RT PCR POSITIVE (A) NEGATIVE Final    Comment: RESULT CALLED TO, READ BACK BY AND VERIFIED WITH: ARIEL SMITH @1823  09/25/19 MJU (NOTE) SARS-CoV-2 target nucleic acids are DETECTED. SARS-CoV-2 RNA is generally detectable in upper respiratory specimens  during the acute phase of infection. Positive results are indicative of the presence of the identified virus, but do not rule out bacterial infection or co-infection with other pathogens not detected by the test. Clinical correlation with patient history and other diagnostic information is necessary to determine patient infection status. The expected result is Negative. Fact Sheet for Patients:  11/23/19 Fact Sheet for Healthcare Providers: This test is not yet approved or cleared by the 11/23/19 FDA and  has been authorized for detection and/or diagnosis of SARS-CoV-2 by FDA under an Emergency Use Authorization (EUA).  This EUA will remain in effect (meaning this  test can be used) for the  duration of  the COVID-19 declaration under Section 564(b)(1) of the Act, 21 U.S.C. section 360bbb-3(b)(1), unless the authorization is terminated or revoked sooner.    Influenza A by PCR NEGATIVE NEGATIVE Final   Influenza B by PCR NEGATIVE NEGATIVE Final    Comment: (NOTE) The Xpert Xpress SARS-CoV-2/FLU/RSV assay is intended as an aid in  the diagnosis of influenza from Nasopharyngeal swab specimens and  should not be used as a sole basis for treatment. Nasal washings and  aspirates are unacceptable for Xpert Xpress SARS-CoV-2/FLU/RSV  testing. Fact Sheet for Patients: PinkCheek.be Fact Sheet for Healthcare Providers: GravelBags.it This test is  not yet approved or cleared by the [place] FDA and  has been authorized for detection and/or diagnosis of SARS-CoV-2 by  FDA under an Emergency Use Authorization (EUA). This EUA will remain  in effect (meaning this test can be used) for the duration of the  Covid-19 declaration under Section 564(b)(1) of the Act, 21  U.S.C. section 360bbb-3(b)(1), unless the authorization is  terminated or revoked. Performed at [facility], [address] [place], [place] [address]      Radiological Exams on Admission: DG Chest [facility] 1 View  Result Date: 09/25/2019 CLINICAL DATA:  COVID-19 positive with hypoxia EXAM: PORTABLE CHEST 1 VIEW COMPARISON:  None. FINDINGS: There is airspace opacity consistent with pneumonia in the left upper lobe, right base, and to a lesser extent in the right upper lobe. Heart size and pulmonary vascularity are normal. No adenopathy. No bone lesions. IMPRESSION: Multifocal pneumonia with greatest degree of airspace opacity in the left upper lobe and right base regions. Cardiac silhouette normal. No adenopathy. Electronically Signed   By: Lowella Grip III M.D.   On: 09/25/2019 16:34    EKG: Independently reviewed. Sinus tachycardia, rate 122, QTc 482.   Assessment/Plan   1. COVID-19 pneumonia with acute hypoxic respiratory failure   - Presents with 10 days of f/c, N/V, malaise, and progressive SOB, saturating upper 80's at home, found to have bilateral infiltrates on CXR and requiring 15 Lpm supplemental O2 to maintain sat in low 90's  - Blood culture was collected in ED and she was given a liter NS, Decadron, and remdesivir  - Continue Decadron and remdesivir, start Actemra if hypoxia worsens, prone as tolerated, trend markers   2. Tachycardia  - HR 120's in ED, down to 110's after a liter NS bolus  - PE considered though no pleuritic pain or evidence for DVT - Given recent N/V and improvement with IVF, hypovolemia more likely  - Will give another  small bolus, continue cardiac monitoring; PE remains on Ddx, will repeat d-dimer in am   3. Elevated transaminases  - AST and ALT in 100-range on admission, no prior labs available for comparison  - Abdominal exam is benign  - Possibly secondary to COVID, will follow daily CMP    4. Depression, anxiety  - Continue Lexapro and Remeron    DVT prophylaxis: Lovenox  Code Status: Full  Family Communication: Discussed with patient  Disposition Plan: Will likely return home once respiratory status close to baseline and stable  Consults called: None  Admission status: Inpatient     Vianne Bulls, MD [facility] Pager: See [email or web address]  If 7AM-7PM, please contact the daytime attending [email or web address]  09/26/2019, 6:18 AM

## 2019-09-26 NOTE — ED Notes (Signed)
Pt signature not obtained. Received verbal authorization for transport from the patient as she was taken to the ambulance.

## 2019-09-26 NOTE — Progress Notes (Signed)
Patient's [place] remains in red.  Dr Antionette Char, Rapid Nurse, and Charge Nurse all came and assessed patient.  Dr Antionette Char states patient can be on routine vital signs, that there is no need for Q15 vital signs.  Patient is maintaining saturation in high 80s to low 90s on 15L HFNC.  Respirations are shallow but no accessory muscles observed during respiratory efforts.  Will monitor patient closely.

## 2019-09-26 NOTE — Plan of Care (Signed)
Care plan initiated.

## 2019-09-26 NOTE — Progress Notes (Signed)
Patient seen and examined and agree with plan of care as per my partner who saw the patient this morning  33white female BMI >50, depression-presenting to [facility] ED after 10 days of fever chills nausea nonbloody vomiting pleuritic chest pain Also found to have elevated transaminases  COVID-19 Labs  Recent Labs    09/25/19 1620  FERRITIN 338*  LDH 361*  CRP 7.1*    Lab Results  Component Value Date   SARSCOV2NAA POSITIVE (A) 09/25/2019   Found to be requiring high flow nasal cannula and had acute hypoxic respiratory failure Started on Decadron and remdesivir Changed to nonrebreather earlier this morning presumably because of desaturation--I cut back the oxygen to 12 L high flow and he was able to do somewhat better She is having some chest pain and headache and she has concerns about the steroids causing genital burning I have reassured her regarding the same She will continue empiric treatment with remdesivir Decadron There is no suggestion of bacterial component given her low procalcitonin  We will follow D-dimer and empirically treat if above 4 with intermediate dosing if it is above that then we can consider scanning the chest  Pleas Koch, MD [facility] 9:06 AM \

## 2019-09-27 ENCOUNTER — Inpatient Hospital Stay (HOSPITAL_COMMUNITY): Payer: HRSA Program

## 2019-09-27 LAB — PROCALCITONIN: Procalcitonin: <0.1 ng/mL

## 2019-09-27 LAB — LACTATE DEHYDROGENASE: LDH: 460 U/L — ABNORMAL HIGH (ref 98–192)

## 2019-09-27 LAB — FERRITIN: Ferritin: 362 ng/mL — ABNORMAL HIGH (ref 11–307)

## 2019-09-27 LAB — C-REACTIVE PROTEIN: CRP: 6.3 mg/dL — ABNORMAL HIGH (ref <1.0)

## 2019-09-27 MED ORDER — SENNOSIDES-DOCUSATE SODIUM 8.6-50 MG PO TABS
1.0000 | ORAL_TABLET | Freq: Every day | ORAL | Status: DC
  Administered 2019-09-27 – 2019-09-30 (×4): 1 via ORAL
  Filled 2019-09-27 (×4): qty 1

## 2019-09-27 MED ORDER — CHLORHEXIDINE GLUCONATE CLOTH 2 % EX PADS
6.0000 | MEDICATED_PAD | Freq: Every day | CUTANEOUS | Status: DC
  Administered 2019-09-27 – 2019-09-30 (×4): 6 via TOPICAL

## 2019-09-27 MED ORDER — TOCILIZUMAB 400 MG/20ML IV SOLN
800.0000 mg | Freq: Once | INTRAVENOUS | Status: AC
  Administered 2019-09-27: 18:00:00 800 mg via INTRAVENOUS
  Filled 2019-09-27: qty 40

## 2019-09-27 NOTE — Progress Notes (Signed)
Pt on 25L and 70% upon arrival.  Pt was 85%.  RT increased flow to 30LPM and FiO2 to 100%.  Sat now 91.  RT will continue to monitor.

## 2019-09-27 NOTE — Plan of Care (Signed)
Respiratory status stable but not improving.  Successfully converted from NRB to HFNC. Problem: Respiratory: Goal: Will maintain a patent airway Outcome: Progressing

## 2019-09-27 NOTE — Progress Notes (Signed)
Patient desaturating into the low 80's when transferring from bed to recliner and slow to recoup. NRB applied in addition to 15L HFNC.

## 2019-09-27 NOTE — Progress Notes (Signed)
Hospitalist progress note   Patient from home, Patient going likely home, Dispo likely will be able to discharge once stabilizes completely not ready hemodynamically for discharge  Lynn Alexander [id number] DOB: [birth year] DOA: 09/26/2019  PCP: Trenda Moots   Narrative:  34 year old white female with BMI >50 admitted from [facility] ED 2/12 with feels cheaper chills nausea pleuritic chest pain elevated transaminases Found to be Covid positive Requiring nonrebreather on admission and intermittently requiring high flow cannula Has been stable   Data Reviewed:  No labs this morning was unable to get other than coronavirus labs COVID-19 Labs  Recent Labs    09/25/19 1620 09/26/19 1415 09/27/19 0415  DDIMER  --  0.78*  --   FERRITIN 338* 351* 362*  LDH 361* 393* 460*  CRP 7.1*  --  6.3*    Lab Results  Component Value Date   SARSCOV2NAA POSITIVE (A) 09/25/2019     Assessment & Plan:  Coronavirus pneumonia Currently on empiric treatment remdesivir/Decadron 2/11 through 2/15 Inflammatory markers stabilizing to some degree We will repeat chest x-ray later this morning and see progression if there is any of pneumonia Procalcitonin not suggestive of superimposed bacterial infection Acute hypoxic respiratory failure secondary to coronavirus Undiagnosed OHSS sleep apnea Will consider trial of BiPAP at bedtime to see if this temporizes and improves things Patient will need outpatient management strategies of the same Body mass index is 54.07 kg/m. Life-threatening needs aggressive weight loss strategies Depression Continue Lexapro 10   Subjective: Informed by nursing about red mews score Sitting in chair currently but desats easily when 15 L is cut back Tells me she never has used CPAP but has not been tested Telling me that occasionally when she gets up and goes to the restroom she feels "dizzy" No chest pain no fever Worried about her kidney function Cannot pass  urine and feels like it is stuck Has never had retention before Long discussion with her about getting a bladder scan and may be a Foley if needed Consultants:   None  Objective: Vitals:   09/27/19 0238 09/27/19 0300 09/27/19 0400 09/27/19 0720  BP:   115/63 109/64  Pulse:      Resp:  (!) 36    Temp: 99.2 F (37.3 C) 99.1 F (37.3 C) 99.2 F (37.3 C) (!) 102.8 F (39.3 C)  TempSrc:  Oral Oral Oral  SpO2:  (!) 88%    Weight:      Height:        Intake/Output Summary (Last 24 hours) at 09/27/2019 0740 Last data filed at 09/27/2019 0500 Gross per 24 hour  Intake 560 ml  Output 400 ml  Net 160 ml   Filed Weights   09/26/19 0500  Weight: (!) 142.9 kg    Examination: Obese pleasant looking less tired than previous EOMI NCAT no focal deficit Mallampati 2 Chest clear no added sound no rales no rhonchi Abdomen soft nontender Mild lower extremity edema Neurologically intact moving all 4 limbs equally  Scheduled Meds: . dexamethasone (DECADRON) injection  6 mg Intravenous Q24H  . enoxaparin (LOVENOX) injection  70 mg Subcutaneous Q24H  . escitalopram  10 mg Oral QHS  . mirtazapine  7.5 mg Oral QHS,MR X 1  . sodium chloride flush  3 mL Intravenous Q12H  . sodium chloride flush  3 mL Intravenous Q12H   Continuous Infusions: . sodium chloride    . remdesivir 100 mg in NS 100 mL Stopped (09/26/19 1138)  LOS: 1 day   Time spent: [place], MD [facility]  09/27/2019, 7:40 AM

## 2019-09-27 NOTE — Progress Notes (Signed)
Patient reviewed and reassessed ~ 41 and feverish--doubt PNA Labs reviewed and low suspicion bact infection--get CXr in am Went over indications and r/b of Actemra she is agreeable to proceed Updated sister  [place]  Pleas Koch, MD [facility] 4:33 PM

## 2019-09-27 NOTE — Progress Notes (Signed)
Assisted to side lying position b/c she says unable to tolerated laying on her stomach/prone position. Heated High Flow [employer] has been decreased this shift 20 L/ 60% and Sats were staying >90% when awake. When she falls asleep sats decrease to <85%. Increased Heated HFNC back up to 70% 25L and sats now 87%. Lying on right side. Temp now 99.8. Denies pain. Continuing to monitor.

## 2019-09-27 NOTE — Progress Notes (Signed)
Spoke with patients sister, Cathlean Cower, and gave update on patients status and POC. Family would like for physician to call if available.

## 2019-09-27 NOTE — Progress Notes (Signed)
Patient has an elevated temp of 103.1. Skin is flushed. HR and RR. Reports she "just isnt feeling good at all". PRN Tylenol given. MD made aware.

## 2019-09-27 NOTE — Progress Notes (Signed)
Patient has a RED mews. MD, Charge, and RR nurse have been made aware. Patient has an elevated temp at 102.8, PRN Tylenol given. Patient is attempting to prone.

## 2019-09-28 LAB — COMPREHENSIVE METABOLIC PANEL
ALT: 81 U/L — ABNORMAL HIGH (ref 0–44)
AST: 66 U/L — ABNORMAL HIGH (ref 15–41)
Albumin: 2.9 g/dL — ABNORMAL LOW (ref 3.5–5.0)
Alkaline Phosphatase: 59 U/L (ref 38–126)
Anion gap: 11 (ref 5–15)
BUN: 11 mg/dL (ref 6–20)
CO2: 24 mmol/L (ref 22–32)
Calcium: 8.5 mg/dL — ABNORMAL LOW (ref 8.9–10.3)
Chloride: 105 mmol/L (ref 98–111)
Creatinine, Ser: 0.7 mg/dL (ref 0.44–1.00)
GFR calc Af Amer: >60 mL/min (ref >60)
GFR calc non Af Amer: >60 mL/min (ref >60)
Glucose, Bld: 155 mg/dL — ABNORMAL HIGH (ref 70–99)
Potassium: 4.1 mmol/L (ref 3.5–5.1)
Sodium: 140 mmol/L (ref 135–145)
Total Bilirubin: 0.6 mg/dL (ref 0.3–1.2)
Total Protein: 6.7 g/dL (ref 6.5–8.1)

## 2019-09-28 LAB — CBC WITH DIFFERENTIAL/PLATELET
Abs Immature Granulocytes: 0.02 10*3/uL (ref 0.00–0.07)
Basophils Absolute: 0 10*3/uL (ref 0.0–0.1)
Basophils Relative: 0 %
Eosinophils Absolute: 0 10*3/uL (ref 0.0–0.5)
Eosinophils Relative: 0 %
HCT: 41 % (ref 36.0–46.0)
Hemoglobin: 13.6 g/dL (ref 12.0–15.0)
Immature Granulocytes: 1 %
Lymphocytes Relative: 35 %
Lymphs Abs: 0.7 10*3/uL (ref 0.7–4.0)
MCH: 31.3 pg (ref 26.0–34.0)
MCHC: 33.2 g/dL (ref 30.0–36.0)
MCV: 94.3 fL (ref 80.0–100.0)
Monocytes Absolute: 0.1 10*3/uL (ref 0.1–1.0)
Monocytes Relative: 5 %
Neutro Abs: 1.2 10*3/uL — ABNORMAL LOW (ref 1.7–7.7)
Neutrophils Relative %: 59 %
Platelets: 215 10*3/uL (ref 150–400)
RBC: 4.35 MIL/uL (ref 3.87–5.11)
RDW: 12.2 % (ref 11.5–15.5)
WBC: 2 10*3/uL — ABNORMAL LOW (ref 4.0–10.5)
nRBC: 0 % (ref 0.0–0.2)

## 2019-09-28 LAB — C-REACTIVE PROTEIN: CRP: 10.9 mg/dL — ABNORMAL HIGH (ref <1.0)

## 2019-09-28 LAB — D-DIMER, QUANTITATIVE: D-Dimer, Quant: 0.77 ug/mL-FEU — ABNORMAL HIGH (ref 0.00–0.50)

## 2019-09-28 LAB — PROCALCITONIN: Procalcitonin: <0.1 ng/mL

## 2019-09-28 LAB — FERRITIN: Ferritin: 367 ng/mL — ABNORMAL HIGH (ref 11–307)

## 2019-09-28 LAB — LACTATE DEHYDROGENASE: LDH: 477 U/L — ABNORMAL HIGH (ref 98–192)

## 2019-09-28 MED ORDER — DIPHENHYDRAMINE HCL 25 MG PO CAPS
25.0000 mg | ORAL_CAPSULE | Freq: Once | ORAL | Status: AC
  Administered 2019-09-28: 25 mg via ORAL
  Filled 2019-09-28: qty 1

## 2019-09-28 MED ORDER — ALUM & MAG HYDROXIDE-SIMETH 200-200-20 MG/5ML PO SUSP
30.0000 mL | Freq: Four times a day (QID) | ORAL | Status: DC | PRN
  Administered 2019-09-28 – 2019-09-29 (×2): 30 mL via ORAL
  Filled 2019-09-28 (×2): qty 30

## 2019-09-28 MED ORDER — SODIUM CHLORIDE 0.9% IV SOLUTION: Freq: Once | INTRAVENOUS | Status: AC

## 2019-09-28 MED ORDER — HYDROXYZINE HCL 10 MG PO TABS
10.0000 mg | ORAL_TABLET | Freq: Once | ORAL | Status: AC
  Administered 2019-09-28: 09:00:00 10 mg via ORAL
  Filled 2019-09-28: qty 1

## 2019-09-28 MED ORDER — ALBUTEROL SULFATE HFA 108 (90 BASE) MCG/ACT IN AERS
1.0000 | INHALATION_SPRAY | RESPIRATORY_TRACT | Status: DC | PRN
  Administered 2019-09-28: 1 via RESPIRATORY_TRACT
  Filled 2019-09-28: qty 6.7

## 2019-09-28 MED ORDER — FUROSEMIDE 10 MG/ML IJ SOLN
20.0000 mg | Freq: Once | INTRAMUSCULAR | Status: AC
  Administered 2019-09-28: 20 mg via INTRAVENOUS
  Filled 2019-09-28: qty 2

## 2019-09-28 NOTE — Progress Notes (Signed)
MEWS/VS Documentation      09/28/2019 1030 09/28/2019 1200 09/28/2019 1225 09/28/2019 1300   MEWS Score:  2  4  2  4    MEWS Score Color:  Yellow  Red  Yellow  Red   Resp:  (!) 31  (!) 40  (!) 35  (!) 38   Pulse:  100  (!) 102  99  (!) 101   BP:  --  109/60  --  107/67   Temp:  --  98.5 F (36.9 C)  --  --   O2 Device:  HFNC  HFNC  --  --   O2 Flow Rate (L/min):  30 L/min  25 L/min  30 L/min  --   FiO2 (%):  80 %  80 %  80 %  --   Level of Consciousness:  --  Alert  --  --     Patient 1200 MEWS red, MD notified.  Patient remains resting in recliner, no complaints at this time.

## 2019-09-28 NOTE — Progress Notes (Signed)
Updated patient point of contact, pt sister 63. Questions answered at time of call.

## 2019-09-28 NOTE — Progress Notes (Signed)
Per report, around 0615 pt experienced coughing spell, tachypnic, became diaphoretic, and appeared anxious.  Upon entering room at 0700, pt state remains same with patient stating she is anxious.  Night shift RN paged night coverage for anti-anxiety, will follow up with dayshift coverage.    0745 antianxiety medication added x1 dose. At this time patient no longer appears anxious, will hold medication.

## 2019-09-28 NOTE — Progress Notes (Signed)
Hospitalist progress note   Patient from home, Patient going likely home, Dispo likely will be able to discharge once stabilizes completely not ready hemodynamically for discharge  KEYONTA MADRID [id number] DOB: February 06, 1986 DOA: 09/26/2019  PCP: Trenda Moots   Narrative:  34 year old white female with BMI >50 admitted from [facility] ED 2/12 with feels cheaper chills nausea pleuritic chest pain elevated transaminases Found to be Covid positive Requiring nonrebreather on admission and intermittently requiring high flow cannula But was transferred to progressive pulmonary unit given elevated work of breathing in addition to need for heated high flow nasal cannula She is becoming slightly more anxious also in addition Because of her worsening respiratory status discussed and given Actemra on 2/13 p.m.   Data Reviewed:  Sodium 140 BUN/creatinine 11/0.7 LFTs as below AST/ALT-->67/89-->66/81 Procalcitonin less than 0.10 WBC down from 5.6-2.0  COVID-19 Labs  Recent Labs    09/25/19 1620 09/25/19 1620 09/26/19 1415 09/27/19 0415 09/28/19 0150  DDIMER  --   --  0.78*  --  0.77*  FERRITIN 338*   < > 351* 362* 367*  LDH 361*   < > 393* 460* 477*  CRP 7.1*  --   --  6.3* 10.9*   < > = values in this interval not displayed.    Lab Results  Component Value Date   SARSCOV2NAA POSITIVE (A) 09/25/2019     Assessment & Plan:  Coronavirus pneumonia Currently on empiric treatment remdesivir/Decadron 2/11 through 2/15 Given worsening condition and appearance with tachypnea and persistent tachycardia felt risk benefits weighed in favor of Actemra which was given on 2/13 We will repeat chest x-ray later this morning and see progression if there is any of pneumonia Procalcitonin not suggestive of superimposed bacterial infection Chest x-ray however shows worsening crowding of airspaces on the right and left side consistent with poor inspirations and poor excursion of lungs She is  agreeable to using the IS and Will prone later today Acute hypoxic respiratory failure secondary to coronavirus Undiagnosed OHSS sleep apnea We will ask RT to see if we can try CPAP or BiPAP when resting Body mass index is 54.07 kg/m. Life-threatening needs aggressive weight loss strategies Acute urinary incontinence Unable to void completely with residuals of over 175 cc therefore Foley placed on 2/13 will attempt clamping trials discontinue medication such as Tussionex with hydrocodone to see if this will help as narcotics can have an effect on the bladder Depression Continue Lexapro 10   Subjective: overall some imporvement--felt better lats night-bought of anxiety this am--no cp no fever no chills no diarr  Consultants:   None  Objective: Vitals:   09/27/19 2342 09/27/19 2343 09/28/19 0415 09/28/19 0600  BP: (!) 100/57  105/66 111/72  Pulse:  97 100 97  Resp:  (!) 28 (!) 25 (!) 31  Temp:  98.3 F (36.8 C) 98.2 F (36.8 C)   TempSrc:  Oral Oral   SpO2:  95% 98% 93%  Weight:      Height:        Intake/Output Summary (Last 24 hours) at 09/28/2019 0735 Last data filed at 09/28/2019 0500 Gross per 24 hour  Intake 840 ml  Output 1125 ml  Net -285 ml   Filed Weights   09/26/19 0500  Weight: (!) 142.9 kg    Examination:  eomi ncat on heated hi flow with NRB on--taking off NRB she maintains her sats without issue--she is now talking in full sentences and seems improved to a degree form  last pm Her wob is still shallow abd soft nt nd no rebound no guard Trace LE edema ROM intact  Scheduled Meds: . Chlorhexidine Gluconate Cloth  6 each Topical Daily  . dexamethasone (DECADRON) injection  6 mg Intravenous Q24H  . enoxaparin (LOVENOX) injection  70 mg Subcutaneous Q24H  . escitalopram  10 mg Oral QHS  . hydrOXYzine  10 mg Oral Once  . mirtazapine  7.5 mg Oral QHS,MR X 1  . senna-docusate  1 tablet Oral QHS  . sodium chloride flush  3 mL Intravenous Q12H  . sodium  chloride flush  3 mL Intravenous Q12H   Continuous Infusions: . sodium chloride    . remdesivir 100 mg in NS 100 mL Stopped (09/27/19 1700)     LOS: 2 days   Time spent: [place], MD [facility]  09/28/2019, 7:35 AM

## 2019-09-29 LAB — PROCALCITONIN: Procalcitonin: <0.1 ng/mL

## 2019-09-29 LAB — COMPREHENSIVE METABOLIC PANEL
ALT: 99 U/L — ABNORMAL HIGH (ref 0–44)
AST: 91 U/L — ABNORMAL HIGH (ref 15–41)
Albumin: 3 g/dL — ABNORMAL LOW (ref 3.5–5.0)
Alkaline Phosphatase: 55 U/L (ref 38–126)
Anion gap: 11 (ref 5–15)
BUN: 17 mg/dL (ref 6–20)
CO2: 25 mmol/L (ref 22–32)
Calcium: 8.5 mg/dL — ABNORMAL LOW (ref 8.9–10.3)
Chloride: 105 mmol/L (ref 98–111)
Creatinine, Ser: 0.67 mg/dL (ref 0.44–1.00)
GFR calc Af Amer: >60 mL/min (ref >60)
GFR calc non Af Amer: >60 mL/min (ref >60)
Glucose, Bld: 151 mg/dL — ABNORMAL HIGH (ref 70–99)
Potassium: 4 mmol/L (ref 3.5–5.1)
Sodium: 141 mmol/L (ref 135–145)
Total Bilirubin: 0.9 mg/dL (ref 0.3–1.2)
Total Protein: 6.8 g/dL (ref 6.5–8.1)

## 2019-09-29 LAB — PREPARE FRESH FROZEN PLASMA
Unit division: 0
Unit division: 0

## 2019-09-29 LAB — CBC WITH DIFFERENTIAL/PLATELET
Abs Immature Granulocytes: 0.04 10*3/uL (ref 0.00–0.07)
Basophils Absolute: 0 10*3/uL (ref 0.0–0.1)
Basophils Relative: 0 %
Eosinophils Absolute: 0 10*3/uL (ref 0.0–0.5)
Eosinophils Relative: 0 %
HCT: 39.6 % (ref 36.0–46.0)
Hemoglobin: 13 g/dL (ref 12.0–15.0)
Immature Granulocytes: 1 %
Lymphocytes Relative: 24 %
Lymphs Abs: 1 10*3/uL (ref 0.7–4.0)
MCH: 30.7 pg (ref 26.0–34.0)
MCHC: 32.8 g/dL (ref 30.0–36.0)
MCV: 93.4 fL (ref 80.0–100.0)
Monocytes Absolute: 0.2 10*3/uL (ref 0.1–1.0)
Monocytes Relative: 5 %
Neutro Abs: 2.8 10*3/uL (ref 1.7–7.7)
Neutrophils Relative %: 70 %
Platelets: 266 10*3/uL (ref 150–400)
RBC: 4.24 MIL/uL (ref 3.87–5.11)
RDW: 12.2 % (ref 11.5–15.5)
WBC: 4 10*3/uL (ref 4.0–10.5)
nRBC: 0 % (ref 0.0–0.2)

## 2019-09-29 LAB — BPAM FFP
Blood Product Expiration Date: 202102151524
Blood Product Expiration Date: 202102151530
ISSUE DATE / TIME: 202102141539
ISSUE DATE / TIME: 202102141539
Unit Type and Rh: 6200
Unit Type and Rh: 6200

## 2019-09-29 LAB — FERRITIN: Ferritin: 393 ng/mL — ABNORMAL HIGH (ref 11–307)

## 2019-09-29 LAB — C-REACTIVE PROTEIN: CRP: 4.1 mg/dL — ABNORMAL HIGH (ref <1.0)

## 2019-09-29 LAB — D-DIMER, QUANTITATIVE: D-Dimer, Quant: 0.68 ug/mL-FEU — ABNORMAL HIGH (ref 0.00–0.50)

## 2019-09-29 LAB — LACTATE DEHYDROGENASE: LDH: 451 U/L — ABNORMAL HIGH (ref 98–192)

## 2019-09-29 MED ORDER — HYDROXYZINE HCL 25 MG PO TABS
25.0000 mg | ORAL_TABLET | Freq: Three times a day (TID) | ORAL | Status: DC
  Administered 2019-09-29 – 2019-10-02 (×10): 25 mg via ORAL
  Filled 2019-09-29 (×13): qty 1

## 2019-09-29 NOTE — Plan of Care (Signed)
  Problem: Education: Goal: Knowledge of risk factors and measures for prevention of condition will improve Outcome: Progressing   Problem: Coping: Goal: Psychosocial and spiritual needs will be supported Outcome: Progressing   Problem: Respiratory: Goal: Will maintain a patent airway Outcome: Progressing Goal: Complications related to the disease process, condition or treatment will be avoided or minimized Outcome: Progressing   

## 2019-09-29 NOTE — Progress Notes (Signed)
RN updated pt's husband, Brayton Caves, on POC. RN will continue to monitor/assess pt.

## 2019-09-29 NOTE — Progress Notes (Signed)
Hospitalist progress note   Patient from home, Patient going likely home, Dispo likely will be able to discharge once stabilizes completely not ready hemodynamically for discharge  Lynn Alexander [id number] DOB: [birth year] DOA: 09/26/2019  PCP: Trenda Moots   Narrative:  34 year old white female with BMI >50 admitted from [facility] ED 2/12 with feels cheaper chills nausea pleuritic chest pain elevated transaminases Found to be Covid positive Requiring nonrebreather on admission and intermittently requiring high flow cannula But was transferred to progressive pulmonary unit given elevated work of breathing in addition to need for heated high flow nasal cannula She is becoming slightly more anxious also in addition Because of her worsening respiratory status discussed and given Actemra on 2/13 p.m. subsequently seen and evaluated and found to have increasing anxiety but also mild increasing work of breathing with red MEWs score-decision made to use convalescent plasma on 2/14   Data Reviewed:  Sodium 140 BUN/creatinine 11/0.7-->17/0.67 LFTs as below AST/ALT-->67/89-->66/81-->91/99 Procalcitonin less than 0.10 WBC down from 5.6-2.0-->4  COVID-19 Labs  Recent Labs    09/26/19 1415 09/26/19 1415 09/27/19 0415 09/28/19 0150 09/29/19 0434  DDIMER 0.78*  --   --  0.77* 0.68*  FERRITIN 351*  --  362* 367*  --   LDH 393*   < > 460* 477* 451*  CRP  --   --  6.3* 10.9*  --    < > = values in this interval not displayed.    Lab Results  Component Value Date   SARSCOV2NAA POSITIVE (A) 09/25/2019     Assessment & Plan:  Coronavirus pneumonia Currently on empiric treatment remdesivir/Decadron 2/11 through 2/15 Given worsening condition patient received Actemra which was given on 2/13 Convalescent plasma given also on 2/14 Procalcitonin not suggestive of superimposed bacterial infection Chest x-ray 2/13 consistent with poor inspirations and poor excursion of lungs-if spikes  fever will repeat Has been able to come down off of high flow nasal cannula to about 25 L and 60% FiO2 and will attempt to lower Acute hypoxic respiratory failure secondary to coronavirus Undiagnosed OHSS sleep apnea We will ask RT to see if we can try CPAP or BiPAP when resting Body mass index is 54.07 kg/m. Life-threatening needs aggressive weight loss strategies Acute urinary incontinence Clamping trial done-reattempt again in 1 to 2 days Depression Continue Lexapro 10 Adding Atarax for further control of her anxiety as a prn  Tory family member updated [phone] on 2/15    Subjective: Slept poorly overnight no chest pain no fever no chills no Reiger eating and drinking okay Every time she gets up she desaturates but she has clearly improved from prior   Consultants:   None  Objective: Vitals:   09/28/19 2355 09/29/19 0208 09/29/19 0507 09/29/19 0717  BP: 116/65  119/66 110/61  Pulse: 96 85 86 87  Resp: (!) 30 (!) 28 (!) 28 (!) 23  Temp: 99.2 F (37.3 C)  98.7 F (37.1 C) 98.6 F (37 C)  TempSrc: Oral  Oral Oral  SpO2: 96% 94% 94% 94%  Weight:      Height:        Intake/Output Summary (Last 24 hours) at 09/29/2019 0730 Last data filed at 09/29/2019 0717 Gross per 24 hour  Intake 2234.6 ml  Output 1435 ml  Net 799.6 ml   Filed Weights   09/26/19 0500  Weight: (!) 142.9 kg    Examination:  eomi ncat on [place] high flow doing okay abd soft nt nd no rebound  no guard Lungs sound relatively clear with no added sound Trace LE edema ROM intact  Scheduled Meds: . Chlorhexidine Gluconate Cloth  6 each Topical Daily  . dexamethasone (DECADRON) injection  6 mg Intravenous Q24H  . enoxaparin (LOVENOX) injection  70 mg Subcutaneous Q24H  . escitalopram  10 mg Oral QHS  . hydrOXYzine  25 mg Oral TID  . mirtazapine  7.5 mg Oral QHS,MR X 1  . senna-docusate  1 tablet Oral QHS  . sodium chloride flush  3 mL Intravenous Q12H  . sodium chloride flush  3 mL  Intravenous Q12H   Continuous Infusions: . sodium chloride    . remdesivir 100 mg in NS 100 mL Stopped (09/28/19 1030)     LOS: 3 days   Time spent: [place], MD [facility]  09/29/2019, 7:30 AM

## 2019-09-30 LAB — CBC WITH DIFFERENTIAL/PLATELET
Abs Immature Granulocytes: 0.08 10*3/uL — ABNORMAL HIGH (ref 0.00–0.07)
Basophils Absolute: 0 10*3/uL (ref 0.0–0.1)
Basophils Relative: 0 %
Eosinophils Absolute: 0 10*3/uL (ref 0.0–0.5)
Eosinophils Relative: 0 %
HCT: 40.6 % (ref 36.0–46.0)
Hemoglobin: 13.4 g/dL (ref 12.0–15.0)
Immature Granulocytes: 2 %
Lymphocytes Relative: 25 %
Lymphs Abs: 1.2 10*3/uL (ref 0.7–4.0)
MCH: 30.8 pg (ref 26.0–34.0)
MCHC: 33 g/dL (ref 30.0–36.0)
MCV: 93.3 fL (ref 80.0–100.0)
Monocytes Absolute: 0.4 10*3/uL (ref 0.1–1.0)
Monocytes Relative: 7 %
Neutro Abs: 3.2 10*3/uL (ref 1.7–7.7)
Neutrophils Relative %: 66 %
Platelets: 339 10*3/uL (ref 150–400)
RBC: 4.35 MIL/uL (ref 3.87–5.11)
RDW: 12.2 % (ref 11.5–15.5)
WBC: 4.9 10*3/uL (ref 4.0–10.5)
nRBC: 0 % (ref 0.0–0.2)

## 2019-09-30 LAB — CULTURE, BLOOD (ROUTINE X 2): Culture: NO GROWTH

## 2019-09-30 LAB — COMPREHENSIVE METABOLIC PANEL
ALT: 171 U/L — ABNORMAL HIGH (ref 0–44)
AST: 122 U/L — ABNORMAL HIGH (ref 15–41)
Albumin: 3.1 g/dL — ABNORMAL LOW (ref 3.5–5.0)
Alkaline Phosphatase: 55 U/L (ref 38–126)
Anion gap: 12 (ref 5–15)
BUN: 19 mg/dL (ref 6–20)
CO2: 24 mmol/L (ref 22–32)
Calcium: 8.6 mg/dL — ABNORMAL LOW (ref 8.9–10.3)
Chloride: 105 mmol/L (ref 98–111)
Creatinine, Ser: 0.69 mg/dL (ref 0.44–1.00)
GFR calc Af Amer: >60 mL/min (ref >60)
GFR calc non Af Amer: >60 mL/min (ref >60)
Glucose, Bld: 145 mg/dL — ABNORMAL HIGH (ref 70–99)
Potassium: 4.2 mmol/L (ref 3.5–5.1)
Sodium: 141 mmol/L (ref 135–145)
Total Bilirubin: 0.6 mg/dL (ref 0.3–1.2)
Total Protein: 6.8 g/dL (ref 6.5–8.1)

## 2019-09-30 LAB — PROCALCITONIN: Procalcitonin: <0.1 ng/mL

## 2019-09-30 LAB — LACTATE DEHYDROGENASE: LDH: 397 U/L — ABNORMAL HIGH (ref 98–192)

## 2019-09-30 LAB — FERRITIN: Ferritin: 413 ng/mL — ABNORMAL HIGH (ref 11–307)

## 2019-09-30 LAB — C-REACTIVE PROTEIN: CRP: 1.5 mg/dL — ABNORMAL HIGH (ref <1.0)

## 2019-09-30 LAB — D-DIMER, QUANTITATIVE: D-Dimer, Quant: 0.56 ug/mL-FEU — ABNORMAL HIGH (ref 0.00–0.50)

## 2019-09-30 MED ORDER — DEXAMETHASONE 6 MG PO TABS
6.0000 mg | ORAL_TABLET | Freq: Every day | ORAL | Status: DC
  Administered 2019-09-30 – 2019-10-02 (×3): 6 mg via ORAL
  Filled 2019-09-30 (×3): qty 1

## 2019-09-30 NOTE — Progress Notes (Signed)
Hospitalist progress note   Patient from home, Patient going likely home, Dispo likely will be able to discharge once stabilizes completely not ready hemodynamically for discharge  Lynn Alexander [id number] DOB: [birth year] DOA: 09/26/2019  PCP: Trenda Moots   Narrative:  34 year old white female with BMI >50 admitted from [facility] ED 2/12 with feels cheaper chills nausea pleuritic chest pain elevated transaminases Found to be Covid positive Requiring nonrebreather on admission and intermittently requiring high flow cannula But was transferred to progressive pulmonary unit given elevated work of breathing in addition to need for heated high flow nasal cannula She is becoming slightly more anxious also in addition Because of her worsening respiratory status discussed and given Actemra on 2/13 p.m. subsequently seen and evaluated and found to have increasing anxiety but also mild increasing work of breathing with red MEWs score-decision made to use convalescent plasma on 2/14   Data Reviewed:  Sodium 140 BUN/creatinine 11/0.7-->17/0.67-->92/0.6 AST/ALT-->67/89-->66/81-->91/99-->23/71 WBC down from 5.6-2.0-->4-->4.9  COVID-19 Labs  Recent Labs    09/28/19 0150 09/29/19 0434 09/30/19 0330  DDIMER 0.77* 0.68* 0.56*  FERRITIN 367* 393* 413*  LDH 477* 451* 397*  CRP 10.9* 4.1* 1.5*    Lab Results  Component Value Date   SARSCOV2NAA POSITIVE (A) 09/25/2019     Assessment & Plan:  Coronavirus pneumonia Currently on empiric treatment remdesivir/Decadron 2/11 through 2/15 Given worsening condition patient received Actemra which was given on 2/13 Convalescent plasma given also on 2/14 Procalcitonin not suggestive of superimposed bacterial infection Chest x-ray 2/13 consistent with poor inspirations--overall is slowly stabilizing and expect recovery but will take some time Transaminitis Etiology unclear-monitor changes and check INR in the morning does not seem to have any  signs or symptoms of cholelithiasis-scan will discontinue Tylenol and only use mild hydrocodone for moderate pain Acute hypoxic respiratory failure secondary to coronavirus Undiagnosed OHSS sleep apnea We will ask RT to see if we can try CPAP or BiPAP when resting Body mass index is 54.07 kg/m. Life-threatening needs aggressive weight loss strategies Acute urinary incontinence Clamping trial done-reattempt again in 1 to 2 days Depression Continue Lexapro 10 Adding Atarax for further control of her anxiety as a prn  Tory family member updated [phone] on 2/15    Subjective: Better overall Eating drinking wa sob ealrier today now on 80% and 20 liters No cp no fveer Asking if foley cathter can stay in today   Consultants:   None  Objective: Vitals:   09/30/19 0200 09/30/19 0300 09/30/19 0412 09/30/19 0800  BP:   (!) 118/53 113/60  Pulse: 84 71 89 77  Resp: (!) 31 (!) 26 (!) 26 (!) 31  Temp:   98.5 F (36.9 C) 98.7 F (37.1 C)  TempSrc:   Oral Axillary  SpO2: 92% 98% 94% 90%  Weight:      Height:        Intake/Output Summary (Last 24 hours) at 09/30/2019 1018 Last data filed at 09/30/2019 0615 Gross per 24 hour  Intake 360 ml  Output 1025 ml  Net -665 ml   Filed Weights   09/26/19 0500  Weight: (!) 142.9 kg    Examination:  Thick neckl pleasant looks beter speaking in full sentences no distress no rales no rhonchi abd soft nt nd no rebound no guard No le edema rom intact no focal deficit Euthymic less anxious  Scheduled Meds: . Chlorhexidine Gluconate Cloth  6 each Topical Daily  . dexamethasone  6 mg Oral Daily  . enoxaparin (  LOVENOX) injection  70 mg Subcutaneous Q24H  . escitalopram  10 mg Oral QHS  . hydrOXYzine  25 mg Oral TID  . mirtazapine  7.5 mg Oral QHS,MR X 1  . senna-docusate  1 tablet Oral QHS  . sodium chloride flush  3 mL Intravenous Q12H  . sodium chloride flush  3 mL Intravenous Q12H   Continuous Infusions: . sodium chloride        LOS: 4 days   Time spent: [place], MD [facility]  09/30/2019, 10:18 AM

## 2019-10-01 LAB — CBC WITH DIFFERENTIAL/PLATELET
Abs Immature Granulocytes: 0.28 10*3/uL — ABNORMAL HIGH (ref 0.00–0.07)
Basophils Absolute: 0 10*3/uL (ref 0.0–0.1)
Basophils Relative: 0 %
Eosinophils Absolute: 0 10*3/uL (ref 0.0–0.5)
Eosinophils Relative: 0 %
HCT: 42 % (ref 36.0–46.0)
Hemoglobin: 13.9 g/dL (ref 12.0–15.0)
Immature Granulocytes: 4 %
Lymphocytes Relative: 29 %
Lymphs Abs: 2.1 10*3/uL (ref 0.7–4.0)
MCH: 30.8 pg (ref 26.0–34.0)
MCHC: 33.1 g/dL (ref 30.0–36.0)
MCV: 93.1 fL (ref 80.0–100.0)
Monocytes Absolute: 0.6 10*3/uL (ref 0.1–1.0)
Monocytes Relative: 8 %
Neutro Abs: 4.3 10*3/uL (ref 1.7–7.7)
Neutrophils Relative %: 59 %
Platelets: 342 10*3/uL (ref 150–400)
RBC: 4.51 MIL/uL (ref 3.87–5.11)
RDW: 12.1 % (ref 11.5–15.5)
WBC: 7.4 10*3/uL (ref 4.0–10.5)
nRBC: 0 % (ref 0.0–0.2)

## 2019-10-01 LAB — HEPATITIS PANEL, ACUTE
HCV Ab: NONREACTIVE
Hep A IgM: NONREACTIVE
Hep B C IgM: NONREACTIVE
Hepatitis B Surface Ag: NONREACTIVE

## 2019-10-01 LAB — FERRITIN: Ferritin: 311 ng/mL — ABNORMAL HIGH (ref 11–307)

## 2019-10-01 LAB — C-REACTIVE PROTEIN: CRP: 0.6 mg/dL (ref <1.0)

## 2019-10-01 LAB — D-DIMER, QUANTITATIVE: D-Dimer, Quant: 0.48 ug/mL-FEU (ref 0.00–0.50)

## 2019-10-01 MED ORDER — FUROSEMIDE 10 MG/ML IJ SOLN
80.0000 mg | Freq: Once | INTRAMUSCULAR | Status: AC
  Administered 2019-10-01: 10:00:00 80 mg via INTRAVENOUS
  Filled 2019-10-01: qty 8

## 2019-10-01 MED ORDER — IPRATROPIUM-ALBUTEROL 20-100 MCG/ACT IN AERS
1.0000 | INHALATION_SPRAY | Freq: Four times a day (QID) | RESPIRATORY_TRACT | Status: DC
  Administered 2019-10-01 – 2019-10-02 (×5): 1 via RESPIRATORY_TRACT
  Filled 2019-10-01: qty 4

## 2019-10-01 MED ORDER — ASCORBIC ACID 500 MG PO TABS
500.0000 mg | ORAL_TABLET | Freq: Every day | ORAL | Status: DC
  Administered 2019-10-01 – 2019-10-02 (×2): 500 mg via ORAL
  Filled 2019-10-01 (×2): qty 1

## 2019-10-01 MED ORDER — ZINC SULFATE 220 (50 ZN) MG PO CAPS
220.0000 mg | ORAL_CAPSULE | Freq: Every day | ORAL | Status: DC
  Administered 2019-10-01 – 2019-10-02 (×2): 220 mg via ORAL
  Filled 2019-10-01 (×2): qty 1

## 2019-10-01 NOTE — Progress Notes (Signed)
PROGRESS NOTE    Lynn Alexander  UXL:[id number] DOB: [birth year] DOA: 09/26/2019 PCP: [facility] Records, PA-C   Brief Narrative:  34 year old with history of depression admitted from [facility] on 2/12 after having nausea, pleuritic chest discomfort, transaminitis and hypoxia.  She was diagnosed with COVID-19 pneumonia.  She was transferred to [facility] hospital initially requiring high flow nasal cannula along with routine treatment of steroids, remdesivir.  She was also given Actemra 2/13 and coalescent plasma 2/14.   Assessment & Plan:   Principal Problem:   Acute hypoxemic respiratory failure due to COVID-19 [facility]) Active Problems:   Depression   Elevated transaminase level   Sinus tachycardia by electrocardiogram  Acute hypoxic respiratory failure secondary to COVID-19 pneumonia -Oxygen levels-6L HFNC -Remdesivir- Completed 2/15 -Decadrone-D7 -Actemra- 2/13 -Convalescent plasma- 2/14 -Antibiotics-None -procalcitonin- Neg (2/16) -Lasix= 80mg  IV (2/17) -Chest x-ray-left lung airspace infiltrate -Supportive care-antitussive, inhalers, I-S/flutter -CODE STATUS confirmed -Vitamin C & Zinc. Prone >16hrs/day.  -Routine: Labs have been reviewed including ferritin, LDH, CRP, d-dimer, fibrinogen.  Will need to trend this lab daily.  Transaminitis -Unclear etiology.  -Check right upper quadrant ultrasound -Acute hepatitis panel  Obesity hypoventilation syndrome/sleep apnea, undiagnosed BMI greater than 50 -Would benefit from outpatient sleep study.  We can attempt to use CPAP here  Depression -Lexapro.  DVT prophylaxis: Lovenox Code Status: Full code Family Communication:  Spoke with her Husband.  Disposition Plan:   Patient From= home  Patient Anticipated D/C place= Home  Barriers= not any home oxygen still on 2 L nasal cannula which I was able to wean down to from 6 L.  If continues to remain stable and less than 4 L nasal cannula, we can consider discharging her  tomorrow.  Subjective: Feels better, on 6 L nasal cannula overnight which I was able to wean down to 2 L nasal cannula was in the room.  She continued to saturate greater than 90%.  Review of Systems Otherwise negative except as per HPI, including: General: Denies fever, chills, night sweats or unintended weight loss. Resp: Denies hemoptysis Cardiac: Denies chest pain, palpitations, orthopnea, paroxysmal nocturnal dyspnea. GI: Denies abdominal pain, nausea, vomiting, diarrhea or constipation GU: Denies dysuria, frequency, hesitancy or incontinence MS: Denies muscle aches, joint pain or swelling Neuro: Denies headache, neurologic deficits (focal weakness, numbness, tingling), abnormal gait Psych: Denies anxiety, depression, SI/HI/AVH Skin: Denies new rashes or lesions ID: Denies sick contacts, exotic exposures, travel  Examination:  General exam: Appears calm and comfortable, 6 L nasal cannula weaned down to 2 L Respiratory system: Bilateral diminished sounds Cardiovascular system: S1 & S2 heard, RRR. No JVD, murmurs, rubs, gallops or clicks. No pedal edema. Gastrointestinal system: Abdomen is nondistended, soft and nontender. No organomegaly or masses felt. Normal bowel sounds heard. Central nervous system: Alert and oriented. No focal neurological deficits. Extremities: Symmetric 5 x 5 power. Skin: No rashes, lesions or ulcers Psychiatry: Judgement and insight appear normal. Mood & affect appropriate.     Objective: Vitals:   09/30/19 2000 09/30/19 2227 09/30/19 2345 10/01/19 0440  BP: 119/79  134/73 112/64  Pulse: 89 94 90 74  Resp: (!) 28 18 (!) 32 (!) 21  Temp: 99.1 F (37.3 C)  98.7 F (37.1 C) 97.8 F (36.6 C)  TempSrc: Oral  Oral Oral  SpO2: 100% 97% 97% 99%  Weight:      Height:        Intake/Output Summary (Last 24 hours) at 10/01/2019 0737 Last data filed at 10/01/2019 0554 Gross per  24 hour  Intake --  Output 1725 ml  Net -1725 ml   Filed Weights    09/26/19 0500  Weight: (!) 142.9 kg     Data Reviewed:   CBC: Recent Labs  Lab 09/25/19 1620 09/26/19 1415 09/28/19 0150 09/29/19 0434 09/30/19 0330  WBC 5.4 5.6 2.0* 4.0 4.9  NEUTROABS 4.4 4.2 1.2* 2.8 3.2  HGB 13.8 13.6 13.6 13.0 13.4  HCT 41.5 41.5 41.0 39.6 40.6  MCV 92.4 93.9 94.3 93.4 93.3  PLT 158 167 215 266 161   Basic Metabolic Panel: Recent Labs  Lab 09/25/19 1620 09/26/19 1415 09/28/19 0150 09/29/19 0434 09/30/19 0330  NA 138 139 140 141 141  K 3.7 3.8 4.1 4.0 4.2  CL 102 105 105 105 105  CO2 25 23 24 25 24   GLUCOSE 119* 111* 155* 151* 145*  BUN 10 11 11 17 19   CREATININE 0.73 0.69 0.70 0.67 0.69  CALCIUM 8.5* 8.3* 8.5* 8.5* 8.6*   GFR: Estimated Creatinine Clearance: 143.4 mL/min (by C-G formula based on SCr of 0.69 mg/dL). Liver Function Tests: Recent Labs  Lab 09/25/19 1620 09/26/19 1415 09/28/19 0150 09/29/19 0434 09/30/19 0330  AST 96* 67* 66* 91* 122*  ALT 106* 89* 81* 99* 171*  ALKPHOS 65 64 59 55 55  BILITOT 0.8 0.7 0.6 0.9 0.6  PROT 7.3 7.0 6.7 6.8 6.8  ALBUMIN 3.5 3.2* 2.9* 3.0* 3.1*   No results for input(s): LIPASE, AMYLASE in the last 168 hours. No results for input(s): AMMONIA in the last 168 hours. Coagulation Profile: No results for input(s): INR, PROTIME in the last 168 hours. Cardiac Enzymes: No results for input(s): CKTOTAL, CKMB, CKMBINDEX, TROPONINI in the last 168 hours. BNP (last 3 results) No results for input(s): PROBNP in the last 8760 hours. HbA1C: No results for input(s): HGBA1C in the last 72 hours. CBG: No results for input(s): GLUCAP in the last 168 hours. Lipid Profile: No results for input(s): CHOL, HDL, LDLCALC, TRIG, CHOLHDL, LDLDIRECT in the last 72 hours. Thyroid Function Tests: No results for input(s): TSH, T4TOTAL, FREET4, T3FREE, THYROIDAB in the last 72 hours. Anemia Panel: Recent Labs    09/29/19 0434 09/30/19 0330  FERRITIN 393* 413*   Sepsis Labs: Recent Labs  Lab 09/25/19 1620  09/26/19 1415 09/27/19 0415 09/28/19 0150 09/29/19 0434 09/30/19 0330  PROCALCITON <0.10   < > <0.10 <0.10 <0.10 <0.10  LATICACIDVEN 1.0  --   --   --   --   --    < > = values in this interval not displayed.    Recent Results (from the past 240 hour(s))  Blood Culture (routine x 2)     Status: None   Collection Time: 09/25/19  4:20 PM   Specimen: BLOOD  Result Value Ref Range Status   Specimen Description BLOOD RIGHT ANTECUBITAL  Final   Special Requests   Final    BOTTLES DRAWN AEROBIC AND ANAEROBIC Blood Culture results may not be optimal due to an inadequate volume of blood received in culture bottles   Culture   Final    NO GROWTH 5 DAYS Performed at [facility], [address] [place], [place] [address]    Report Status 09/30/2019 FINAL  Final  Respiratory Panel by RT PCR (Flu A&B, Covid) - Nasopharyngeal Swab     Status: Abnormal   Collection Time: 09/25/19  5:24 PM   Specimen: Nasopharyngeal Swab  Result Value Ref Range Status   SARS Coronavirus 2 by RT  PCR POSITIVE (A) NEGATIVE Final    Comment: RESULT CALLED TO, READ BACK BY AND VERIFIED WITH: ARIEL SMITH @1823  09/25/19 MJU (NOTE) SARS-CoV-2 target nucleic acids are DETECTED. SARS-CoV-2 RNA is generally detectable in upper respiratory specimens  during the acute phase of infection. Positive results are indicative of the presence of the identified virus, but do not rule out bacterial infection or co-infection with other pathogens not detected by the test. Clinical correlation with patient history and other diagnostic information is necessary to determine patient infection status. The expected result is Negative. Fact Sheet for Patients:  11/23/19 Fact Sheet for Healthcare Providers: https://www.moore.com/ This test is not yet approved or cleared by the https://www.young.biz/ FDA and  has been authorized for detection and/or diagnosis of SARS-CoV-2 by FDA  under an Emergency Use Authorization (EUA).  This EUA will remain in effect (meaning this test can be used) for the  duration of  the COVID-19 declaration under Section 564(b)(1) of the Act, 21 U.S.C. section 360bbb-3(b)(1), unless the authorization is terminated or revoked sooner.    Influenza A by PCR NEGATIVE NEGATIVE Final   Influenza B by PCR NEGATIVE NEGATIVE Final    Comment: (NOTE) The Xpert Xpress SARS-CoV-2/FLU/RSV assay is intended as an aid in  the diagnosis of influenza from Nasopharyngeal swab specimens and  should not be used as a sole basis for treatment. Nasal washings and  aspirates are unacceptable for Xpert Xpress SARS-CoV-2/FLU/RSV  testing. Fact Sheet for Patients: [place] Fact Sheet for Healthcare Providers: https://www.moore.com/ This test is not yet approved or cleared by the https://www.young.biz/ FDA and  has been authorized for detection and/or diagnosis of SARS-CoV-2 by  FDA under an Emergency Use Authorization (EUA). This EUA will remain  in effect (meaning this test can be used) for the duration of the  Covid-19 declaration under Section 564(b)(1) of the Act, 21  U.S.C. section 360bbb-3(b)(1), unless the authorization is  terminated or revoked. Performed at [facility], [address] [place], [place]          Radiology Studies: No results found.      Scheduled Meds: . dexamethasone  6 mg Oral Daily  . enoxaparin (LOVENOX) injection  70 mg Subcutaneous Q24H  . escitalopram  10 mg Oral QHS  . hydrOXYzine  25 mg Oral TID  . mirtazapine  7.5 mg Oral QHS,MR X 1  . senna-docusate  1 tablet Oral QHS  . sodium chloride flush  3 mL Intravenous Q12H  . sodium chloride flush  3 mL Intravenous Q12H   Continuous Infusions: . sodium chloride       LOS: 5 days   Time spent= 35 mins    Zebbie Ace [id number], MD [facility]  If 7PM-7AM, please contact  night-coverage  10/01/2019, 7:37 AM

## 2019-10-01 NOTE — Plan of Care (Signed)
  Problem: Education: Goal: Knowledge of risk factors and measures for prevention of condition will improve Outcome: Completed/Met   Problem: Coping: Goal: Psychosocial and spiritual needs will be supported Outcome: Completed/Met   Problem: Respiratory: Goal: Will maintain a patent airway Outcome: Completed/Met Goal: Complications related to the disease process, condition or treatment will be avoided or minimized Outcome: Completed/Met

## 2019-10-02 ENCOUNTER — Inpatient Hospital Stay (HOSPITAL_COMMUNITY): Payer: HRSA Program

## 2019-10-02 LAB — COMPREHENSIVE METABOLIC PANEL
ALT: 131 U/L — ABNORMAL HIGH (ref 0–44)
AST: 42 U/L — ABNORMAL HIGH (ref 15–41)
Albumin: 3.2 g/dL — ABNORMAL LOW (ref 3.5–5.0)
Alkaline Phosphatase: 49 U/L (ref 38–126)
Anion gap: 13 (ref 5–15)
BUN: 22 mg/dL — ABNORMAL HIGH (ref 6–20)
CO2: 21 mmol/L — ABNORMAL LOW (ref 22–32)
Calcium: 8.7 mg/dL — ABNORMAL LOW (ref 8.9–10.3)
Chloride: 103 mmol/L (ref 98–111)
Creatinine, Ser: 0.73 mg/dL (ref 0.44–1.00)
GFR calc Af Amer: >60 mL/min (ref >60)
GFR calc non Af Amer: >60 mL/min (ref >60)
Glucose, Bld: 102 mg/dL — ABNORMAL HIGH (ref 70–99)
Potassium: 3.7 mmol/L (ref 3.5–5.1)
Sodium: 137 mmol/L (ref 135–145)
Total Bilirubin: 0.9 mg/dL (ref 0.3–1.2)
Total Protein: 6.6 g/dL (ref 6.5–8.1)

## 2019-10-02 LAB — CBC
HCT: 43.1 % (ref 36.0–46.0)
Hemoglobin: 14 g/dL (ref 12.0–15.0)
MCH: 30.8 pg (ref 26.0–34.0)
MCHC: 32.5 g/dL (ref 30.0–36.0)
MCV: 94.9 fL (ref 80.0–100.0)
Platelets: 398 10*3/uL (ref 150–400)
RBC: 4.54 MIL/uL (ref 3.87–5.11)
RDW: 12.3 % (ref 11.5–15.5)
WBC: 9.9 10*3/uL (ref 4.0–10.5)
nRBC: 0 % (ref 0.0–0.2)

## 2019-10-02 LAB — D-DIMER, QUANTITATIVE: D-Dimer, Quant: 0.46 ug/mL-FEU (ref 0.00–0.50)

## 2019-10-02 LAB — MAGNESIUM: Magnesium: 2.3 mg/dL (ref 1.7–2.4)

## 2019-10-02 MED ORDER — ALBUTEROL SULFATE HFA 108 (90 BASE) MCG/ACT IN AERS: 1.0000 | INHALATION_SPRAY | RESPIRATORY_TRACT | 1 refills | Status: DC | PRN

## 2019-10-02 MED ORDER — DEXAMETHASONE 6 MG PO TABS: 6.0000 mg | ORAL_TABLET | Freq: Every day | ORAL | 0 refills | Status: AC

## 2019-10-02 MED ORDER — POTASSIUM CHLORIDE CRYS ER 20 MEQ PO TBCR
40.0000 meq | EXTENDED_RELEASE_TABLET | Freq: Once | ORAL | Status: AC
  Administered 2019-10-02: 40 meq via ORAL
  Filled 2019-10-02: qty 2

## 2019-10-02 NOTE — Discharge Summary (Signed)
Physician Discharge Summary  Lynn Alexander BDZ:[id number] DOB: April 11, 1986 DOA: 09/26/2019  PCP: Rica Records, PA-C  Admit date: 09/26/2019 Discharge date: 10/02/2019  Admitted From: Home Disposition: Home  Recommendations for Outpatient Follow-up:  1. Follow up with PCP in 1-2 weeks 2. Please obtain BMP/CBC in one week your next doctors visit.  3. 1 more day of oral prednisone prescribed.  Inhaler prescribed to be used as needed 4. Otherwise continue supportive measures at home  Discharge Condition: Stable CODE STATUS: Full Diet recommendation: Regular  Brief/Interim Summary:34 year old with history of depression admitted from [facility] on 2/12 after having nausea, pleuritic chest discomfort, transaminitis and hypoxia.  She was diagnosed with COVID-19 pneumonia.  She was transferred to [facility] hospital initially requiring high flow nasal cannula along with routine treatment of steroids, remdesivir.  She was also given Actemra 2/13 and coalescent plasma 2/14.  Over the course of several days she was weaned off oxygen and remained on room air saturating greater than 95%.  She also received 1 dose of diuretic during the hospitalization.  LFTs were mildly elevated during the hospitalization, acute hepatitis panel was negative, LFTs trended down.  Results for right upper quadrant ultrasound was pending.  Spoke with the husband on the day of discharge.  Would recommend outpatient sleep study. Stable for discharge today.  Acute hypoxic respiratory failure secondary to COVID-19 pneumonia -Patient is now on room air saturating greater than 95%.  Completed 5 days of remdesivir.  We will get 1 more day of p.o. Decadron after discharge.  Received Actemra and convalescent plasma during hospitalization.  Transaminitis -Improved, acute hepatitis panel negative.  Right upper quadrant ultrasound done-results pending.  Obesity hypoventilation syndrome/sleep apnea, undiagnosed BMI greater than 50 -Would  benefit from outpatient sleep study.  We can attempt to use CPAP here  Depression -Lexapro.  Her husband updated on the day of discharge  Discharge Diagnoses:  Principal Problem:   Acute hypoxemic respiratory failure due to COVID-19 [facility]) Active Problems:   Depression   Elevated transaminase level   Sinus tachycardia by electrocardiogram    Consultations:  None  Subjective: Feels great, no complaints.  Wishes to go home.  Discharge Exam: Vitals:   10/02/19 0324 10/02/19 0720  BP: 123/71 108/70  Pulse: 77 82  Resp: (!) 31 (!) 24  Temp: 98.4 F (36.9 C) 97.6 F (36.4 C)  SpO2: 91% (!) 89%   Vitals:   10/01/19 1914 10/02/19 0010 10/02/19 0324 10/02/19 0720  BP: (!) 117/57 115/74 123/71 108/70  Pulse: 97 76 77 82  Resp: (!) 24 (!) 32 (!) 31 (!) 24  Temp: 98.6 F (37 C) 98.1 F (36.7 C) 98.4 F (36.9 C) 97.6 F (36.4 C)  TempSrc:   Oral Oral  SpO2: (!) 88% 91% 91% (!) 89%  Weight:      Height:        General: Pt is alert, awake, not in acute distress Cardiovascular: RRR, S1/S2 +, no rubs, no gallops Respiratory: CTA bilaterally, no wheezing, no rhonchi Abdominal: Soft, NT, ND, bowel sounds + Extremities: no edema, no cyanosis  Discharge Instructions  Discharge Instructions    Diet - low sodium heart healthy   Complete by: As directed    Discharge instructions   Complete by: As directed    You were cared for by a hospitalist during your hospital stay. If you have any questions about your discharge medications or the care you received while you were in the hospital after you are discharged,  you can call the unit and asked to speak with the hospitalist on call if the hospitalist that took care of you is not available. Once you are discharged, your primary care physician will handle any further medical issues. Please note that NO REFILLS for any discharge medications will be authorized once you are discharged, as it is imperative that you return to your  primary care physician (or establish a relationship with a primary care physician if you do not have one) for your aftercare needs so that they can reassess your need for medications and monitor your lab values.  Please request your Prim.MD to go over all Hospital Tests and Procedure/Radiological results at the follow up, please get all Hospital records sent to your Prim MD by signing hospital release before you go home.  Get CBC, CMP, 2 view Chest X ray checked  by Primary MD during your next visit or SNF MD in 5-7 days ( we routinely change or add medications that can affect your baseline labs and fluid status, therefore we recommend that you get the mentioned basic workup next visit with your PCP, your PCP may decide not to get them or add new tests based on their clinical decision)  On your next visit with your primary care physician please Get Medicines reviewed and adjusted.  If you experience worsening of your admission symptoms, develop shortness of breath, life threatening emergency, suicidal or homicidal thoughts you must seek medical attention immediately by calling 911 or calling your MD immediately  if symptoms less severe.  You Must read complete instructions/literature along with all the possible adverse reactions/side effects for all the Medicines you take and that have been prescribed to you. Take any new Medicines after you have completely understood and accpet all the possible adverse reactions/side effects.   Do not drive, operate heavy machinery, perform activities at heights, swimming or participation in water activities or provide baby sitting services if your were admitted for syncope or siezures until you have seen by Primary MD or a Neurologist and advised to do so again.  Do not drive when taking Pain medications.   Increase activity slowly   Complete by: As directed      Allergies as of 10/02/2019   No Known Allergies     Medication List    TAKE these medications    acetaminophen 500 MG tablet Commonly known as: TYLENOL Take 500-1,000 mg by mouth every 6 (six) hours as needed for mild pain or fever.   albuterol 108 (90 Base) MCG/ACT inhaler Commonly known as: VENTOLIN HFA Inhale 1 puff into the lungs every 4 (four) hours as needed for wheezing or shortness of breath.   ascorbic acid 500 MG tablet Commonly known as: VITAMIN C Take 500 mg by mouth daily.   cholecalciferol 25 MCG (1000 UNIT) tablet Commonly known as: VITAMIN D3 Take 1,000 Units by mouth daily.   dexamethasone 6 MG tablet Commonly known as: DECADRON Take 1 tablet (6 mg total) by mouth daily for 1 day. Start taking on: October 03, 2019   escitalopram 10 MG tablet Commonly known as: LEXAPRO Take 10 mg by mouth at bedtime.   ibuprofen 200 MG tablet Commonly known as: ADVIL Take 200-400 mg by mouth every 6 (six) hours as needed for fever or mild pain.   mirtazapine 15 MG tablet Commonly known as: REMERON Take 7.5 mg by mouth See admin instructions. Take  tablet (7.5mg ) by mouth at bedtime and repeat dose if needed overnight  ondansetron 8 MG tablet Commonly known as: ZOFRAN Take 8 mg by mouth every 8 (eight) hours as needed for nausea or vomiting.   vitamin B-12 1000 MCG tablet Commonly known as: CYANOCOBALAMIN Take 1,000 mcg by mouth daily.   zinc sulfate 220 (50 Zn) MG capsule Take 220 mg by mouth daily.      Follow-up Information    Copland, Ilona Sorrellicia B, PA-C. Schedule an appointment as soon as possible for a visit in 2 week(s).   Specialty: Obstetrics and Gynecology Contact information: [address] [place] [address] [phone]          No Known Allergies  You were cared for by a hospitalist during your hospital stay. If you have any questions about your discharge medications or the care you received while you were in the hospital after you are discharged, you can call the unit and asked to speak with the hospitalist on call if the  hospitalist that took care of you is not available. Once you are discharged, your primary care physician will handle any further medical issues. Please note that no refills for any discharge medications will be authorized once you are discharged, as it is imperative that you return to your primary care physician (or establish a relationship with a primary care physician if you do not have one) for your aftercare needs so that they can reassess your need for medications and monitor your lab values.   Procedures/Studies: DG Chest 2 View  Result Date: 09/27/2019 CLINICAL DATA:  COVID positive hypoxia EXAM: CHEST - 2 VIEW COMPARISON:  Chest radiograph 07/14/2020 FINDINGS: Stable cardiomediastinal contours given AP technique and low volumes. Airspace opacities in the left upper lung appear increased from prior. Persistent airspace opacities in the right upper and right lower lung. No evidence of pneumothorax or significant pleural effusion. IMPRESSION: Worsening airspace opacities in the left upper lung. Persistent airspace opacities in the right upper and right lower lung. Electronically Signed   By: Emmaline KluverNancy  Ballantyne M.D.   On: 09/27/2019 17:13   DG Chest Port 1 View  Result Date: 09/25/2019 CLINICAL DATA:  COVID-19 positive with hypoxia EXAM: PORTABLE CHEST 1 VIEW COMPARISON:  None. FINDINGS: There is airspace opacity consistent with pneumonia in the left upper lobe, right base, and to a lesser extent in the right upper lobe. Heart size and pulmonary vascularity are normal. No adenopathy. No bone lesions. IMPRESSION: Multifocal pneumonia with greatest degree of airspace opacity in the left upper lobe and right base regions. Cardiac silhouette normal. No adenopathy. Electronically Signed   By: Bretta BangWilliam  Woodruff III M.D.   On: 09/25/2019 16:34      The results of significant diagnostics from this hospitalization (including imaging, microbiology, ancillary and laboratory) are listed below for reference.      Microbiology: Recent Results (from the past 240 hour(s))  Blood Culture (routine x 2)     Status: None   Collection Time: 09/25/19  4:20 PM   Specimen: BLOOD  Result Value Ref Range Status   Specimen Description BLOOD RIGHT ANTECUBITAL  Final   Special Requests   Final    BOTTLES DRAWN AEROBIC AND ANAEROBIC Blood Culture results may not be optimal due to an inadequate volume of blood received in culture bottles   Culture   Final    NO GROWTH 5 DAYS Performed at [facility], [address] [place], [place] [address]    Report Status 09/30/2019 FINAL  Final  Respiratory Panel by RT PCR (Flu A&B, Covid) -  Nasopharyngeal Swab     Status: Abnormal   Collection Time: 09/25/19  5:24 PM   Specimen: Nasopharyngeal Swab  Result Value Ref Range Status   SARS Coronavirus 2 by RT PCR POSITIVE (A) NEGATIVE Final    Comment: RESULT CALLED TO, READ BACK BY AND VERIFIED WITH: ARIEL SMITH @1823  09/25/19 MJU (NOTE) SARS-CoV-2 target nucleic acids are DETECTED. SARS-CoV-2 RNA is generally detectable in upper respiratory specimens  during the acute phase of infection. Positive results are indicative of the presence of the identified virus, but do not rule out bacterial infection or co-infection with other pathogens not detected by the test. Clinical correlation with patient history and other diagnostic information is necessary to determine patient infection status. The expected result is Negative. Fact Sheet for Patients:  11/23/19 Fact Sheet for Healthcare Providers: https://www.moore.com/ This test is not yet approved or cleared by the https://www.young.biz/ FDA and  has been authorized for detection and/or diagnosis of SARS-CoV-2 by FDA under an Emergency Use Authorization (EUA).  This EUA will remain in effect (meaning this test can be used) for the  duration of  the COVID-19 declaration under Section 564(b)(1) of the Act, 21 U.S.C.  section 360bbb-3(b)(1), unless the authorization is terminated or revoked sooner.    Influenza A by PCR NEGATIVE NEGATIVE Final   Influenza B by PCR NEGATIVE NEGATIVE Final    Comment: (NOTE) The Xpert Xpress SARS-CoV-2/FLU/RSV assay is intended as an aid in  the diagnosis of influenza from Nasopharyngeal swab specimens and  should not be used as a sole basis for treatment. Nasal washings and  aspirates are unacceptable for Xpert Xpress SARS-CoV-2/FLU/RSV  testing. Fact Sheet for Patients: [place] Fact Sheet for Healthcare Providers: https://www.moore.com/ This test is not yet approved or cleared by the https://www.young.biz/ FDA and  has been authorized for detection and/or diagnosis of SARS-CoV-2 by  FDA under an Emergency Use Authorization (EUA). This EUA will remain  in effect (meaning this test can be used) for the duration of the  Covid-19 declaration under Section 564(b)(1) of the Act, 21  U.S.C. section 360bbb-3(b)(1), unless the authorization is  terminated or revoked. Performed at [facility], [address] [place], [place]      Labs: BNP (last 3 results) No results for input(s): BNP in the last 8760 hours. Basic Metabolic Panel: Recent Labs  Lab 09/26/19 1415 09/28/19 0150 09/29/19 0434 09/30/19 0330 10/02/19 0205  NA 139 140 141 141 137  K 3.8 4.1 4.0 4.2 3.7  CL 105 105 105 105 103  CO2 23 24 25 24  21*  GLUCOSE 111* 155* 151* 145* 102*  BUN 11 11 17 19  22*  CREATININE 0.69 0.70 0.67 0.69 0.73  CALCIUM 8.3* 8.5* 8.5* 8.6* 8.7*  MG  --   --   --   --  2.3   Liver Function Tests: Recent Labs  Lab 09/26/19 1415 09/28/19 0150 09/29/19 0434 09/30/19 0330 10/02/19 0205  AST 67* 66* 91* 122* 42*  ALT 89* 81* 99* 171* 131*  ALKPHOS 64 59 55 55 49  BILITOT 0.7 0.6 0.9 0.6 0.9  PROT 7.0 6.7 6.8 6.8 6.6  ALBUMIN 3.2* 2.9* 3.0* 3.1* 3.2*   No results for input(s): LIPASE, AMYLASE in the  last 168 hours. No results for input(s): AMMONIA in the last 168 hours. CBC: Recent Labs  Lab 09/26/19 1415 09/26/19 1415 09/28/19 0150 09/29/19 0434 09/30/19 0330 10/01/19 0715 10/02/19 0205  WBC 5.6   < > 2.0* 4.0  4.9 7.4 9.9  NEUTROABS 4.2  --  1.2* 2.8 3.2 4.3  --   HGB 13.6   < > 13.6 13.0 13.4 13.9 14.0  HCT 41.5   < > 41.0 39.6 40.6 42.0 43.1  MCV 93.9   < > 94.3 93.4 93.3 93.1 94.9  PLT 167   < > 215 266 339 342 398   < > = values in this interval not displayed.   Cardiac Enzymes: No results for input(s): CKTOTAL, CKMB, CKMBINDEX, TROPONINI in the last 168 hours. BNP: Invalid input(s): POCBNP CBG: No results for input(s): GLUCAP in the last 168 hours. D-Dimer Recent Labs    10/01/19 0715 10/02/19 0205  DDIMER 0.48 0.46   Hgb A1c No results for input(s): HGBA1C in the last 72 hours. Lipid Profile No results for input(s): CHOL, HDL, LDLCALC, TRIG, CHOLHDL, LDLDIRECT in the last 72 hours. Thyroid function studies No results for input(s): TSH, T4TOTAL, T3FREE, THYROIDAB in the last 72 hours.  Invalid input(s): FREET3 Anemia work up Recent Labs    09/30/19 0330 10/01/19 0715  FERRITIN 413* 311*   Urinalysis No results found for: COLORURINE, APPEARANCEUR, LABSPEC, PHURINE, GLUCOSEU, HGBUR, BILIRUBINUR, KETONESUR, PROTEINUR, UROBILINOGEN, NITRITE, LEUKOCYTESUR Sepsis Labs Invalid input(s): PROCALCITONIN,  WBC,  LACTICIDVEN Microbiology Recent Results (from the past 240 hour(s))  Blood Culture (routine x 2)     Status: None   Collection Time: 09/25/19  4:20 PM   Specimen: BLOOD  Result Value Ref Range Status   Specimen Description BLOOD RIGHT ANTECUBITAL  Final   Special Requests   Final    BOTTLES DRAWN AEROBIC AND ANAEROBIC Blood Culture results may not be optimal due to an inadequate volume of blood received in culture bottles   Culture   Final    NO GROWTH 5 DAYS Performed at [facility], [address] [place], [place] [address]     Report Status 09/30/2019 FINAL  Final  Respiratory Panel by RT PCR (Flu A&B, Covid) - Nasopharyngeal Swab     Status: Abnormal   Collection Time: 09/25/19  5:24 PM   Specimen: Nasopharyngeal Swab  Result Value Ref Range Status   SARS Coronavirus 2 by RT PCR POSITIVE (A) NEGATIVE Final    Comment: RESULT CALLED TO, READ BACK BY AND VERIFIED WITH: ARIEL SMITH @1823  09/25/19 MJU (NOTE) SARS-CoV-2 target nucleic acids are DETECTED. SARS-CoV-2 RNA is generally detectable in upper respiratory specimens  during the acute phase of infection. Positive results are indicative of the presence of the identified virus, but do not rule out bacterial infection or co-infection with other pathogens not detected by the test. Clinical correlation with patient history and other diagnostic information is necessary to determine patient infection status. The expected result is Negative. Fact Sheet for Patients:  11/23/19 Fact Sheet for Healthcare Providers: https://www.moore.com/ This test is not yet approved or cleared by the https://www.young.biz/ FDA and  has been authorized for detection and/or diagnosis of SARS-CoV-2 by FDA under an Emergency Use Authorization (EUA).  This EUA will remain in effect (meaning this test can be used) for the  duration of  the COVID-19 declaration under Section 564(b)(1) of the Act, 21 U.S.C. section 360bbb-3(b)(1), unless the authorization is terminated or revoked sooner.    Influenza A by PCR NEGATIVE NEGATIVE Final   Influenza B by PCR NEGATIVE NEGATIVE Final    Comment: (NOTE) The Xpert Xpress SARS-CoV-2/FLU/RSV assay is intended as an aid in  the diagnosis of influenza from Nasopharyngeal swab specimens and  should not be used as a sole basis for treatment. Nasal washings and  aspirates are unacceptable for Xpert Xpress SARS-CoV-2/FLU/RSV  testing. Fact Sheet for Patients: PinkCheek.be Fact  Sheet for Healthcare Providers: GravelBags.it This test is not yet approved or cleared by the [place] FDA and  has been authorized for detection and/or diagnosis of SARS-CoV-2 by  FDA under an Emergency Use Authorization (EUA). This EUA will remain  in effect (meaning this test can be used) for the duration of the  Covid-19 declaration under Section 564(b)(1) of the Act, 21  U.S.C. section 360bbb-3(b)(1), unless the authorization is  terminated or revoked. Performed at [facility], [address] [place], [place] [address]      Time coordinating discharge:  I have spent 35 minutes face to face with the patient and on the ward discussing the patients care, assessment, plan and disposition with other care givers. >50% of the time was devoted counseling the patient about the risks and benefits of treatment/Discharge disposition and coordinating care.   SIGNED:   Damita Lack, MD  [facility] 10/02/2019, 9:57 AM   If 7PM-7AM, please contact night-coverage

## 2019-10-02 NOTE — Progress Notes (Signed)
Patient discharged to home.  Vital signs stable.  Patient walked to car via wheelchair with all belongings.

## 2019-10-02 NOTE — Discharge Instructions (Signed)
Person Under Monitoring Name: Lynn Alexander  Location: [address] [place] [address]   Infection Prevention Recommendations for Individuals Confirmed to have, or Being Evaluated for, 2019 Novel Coronavirus (COVID-19) Infection Who Receive Care at Home  Individuals who are confirmed to have, or are being evaluated for, COVID-19 should follow the prevention steps below until a healthcare provider or local or state health department says they can return to normal activities.  Stay home except to get medical care You should restrict activities outside your home, except for getting medical care. Do not go to work, school, or public areas, and do not use public transportation or taxis.  Call ahead before visiting your doctor Before your medical appointment, call the healthcare provider and tell them that you have, or are being evaluated for, COVID-19 infection. This will help the healthcare provider's office take steps to keep other people from getting infected. Ask your healthcare provider to call the local or state health department.  Monitor your symptoms Seek prompt medical attention if your illness is worsening (e.g., difficulty breathing). Before going to your medical appointment, call the healthcare provider and tell them that you have, or are being evaluated for, COVID-19 infection. Ask your healthcare provider to call the local or state health department.  Wear a facemask You should wear a facemask that covers your nose and mouth when you are in the same room with other people and when you visit a healthcare provider. People who live with or visit you should also wear a facemask while they are in the same room with you.  Separate yourself from other people in your home As much as possible, you should stay in a different room from other people in your home. Also, you should use a separate bathroom, if available.  Avoid sharing household items You should not  share dishes, drinking glasses, cups, eating utensils, towels, bedding, or other items with other people in your home. After using these items, you should wash them thoroughly with soap and water.  Cover your coughs and sneezes Cover your mouth and nose with a tissue when you cough or sneeze, or you can cough or sneeze into your sleeve. Throw used tissues in a lined trash can, and immediately wash your hands with soap and water for at least 20 seconds or use an alcohol-based hand rub.  Wash your [employer] your hands often and thoroughly with soap and water for at least 20 seconds. You can use an alcohol-based hand sanitizer if soap and water are not available and if your hands are not visibly dirty. Avoid touching your eyes, nose, and mouth with unwashed hands.   Prevention Steps for Caregivers and Household Members of Individuals Confirmed to have, or Being Evaluated for, COVID-19 Infection Being Cared for in the Home  If you live with, or provide care at home for, a person confirmed to have, or being evaluated for, COVID-19 infection please follow these guidelines to prevent infection:  Follow healthcare provider's instructions Make sure that you understand and can help the patient follow any healthcare provider instructions for all care.  Provide for the patient's basic needs You should help the patient with basic needs in the home and provide support for getting groceries, prescriptions, and other personal needs.  Monitor the patient's symptoms If they are getting sicker, call his or her medical provider and tell them that the patient has, or is being evaluated for, COVID-19 infection. This will help the healthcare provider's  office take steps to keep other people from getting infected. Ask the healthcare provider to call the local or state health department.  Limit the number of people who have contact with the patient  If possible, have only one caregiver for the  patient.  Other household members should stay in another home or place of residence. If this is not possible, they should stay  in another room, or be separated from the patient as much as possible. Use a separate bathroom, if available.  Restrict visitors who do not have an essential need to be in the home.  Keep older adults, very young children, and other sick people away from the patient Keep older adults, very young children, and those who have compromised immune systems or chronic health conditions away from the patient. This includes people with chronic heart, lung, or kidney conditions, diabetes, and cancer.  Ensure good ventilation Make sure that shared spaces in the home have good air flow, such as from an air conditioner or an opened window, weather permitting.  Wash your hands often  Wash your hands often and thoroughly with soap and water for at least 20 seconds. You can use an alcohol based hand sanitizer if soap and water are not available and if your hands are not visibly dirty.  Avoid touching your eyes, nose, and mouth with unwashed hands.  Use disposable paper towels to dry your hands. If not available, use dedicated cloth towels and replace them when they become wet.  Wear a facemask and gloves  Wear a disposable facemask at all times in the room and gloves when you touch or have contact with the patient's blood, body fluids, and/or secretions or excretions, such as sweat, saliva, sputum, nasal mucus, vomit, urine, or feces.  Ensure the mask fits over your nose and mouth tightly, and do not touch it during use.  Throw out disposable facemasks and gloves after using them. Do not reuse.  Wash your hands immediately after removing your facemask and gloves.  If your personal clothing becomes contaminated, carefully remove clothing and launder. Wash your hands after handling contaminated clothing.  Place all used disposable facemasks, gloves, and other waste in a lined  container before disposing them with other household waste.  Remove gloves and wash your hands immediately after handling these items.  Do not share dishes, glasses, or other household items with the patient  Avoid sharing household items. You should not share dishes, drinking glasses, cups, eating utensils, towels, bedding, or other items with a patient who is confirmed to have, or being evaluated for, COVID-19 infection.  After the person uses these items, you should wash them thoroughly with soap and water.  Wash laundry thoroughly  Immediately remove and wash clothes or bedding that have blood, body fluids, and/or secretions or excretions, such as sweat, saliva, sputum, nasal mucus, vomit, urine, or feces, on them.  Wear gloves when handling laundry from the patient.  Read and follow directions on labels of laundry or clothing items and detergent. In general, wash and dry with the warmest temperatures recommended on the label.  Clean all areas the individual has used often  Clean all touchable surfaces, such as counters, tabletops, doorknobs, bathroom fixtures, toilets, phones, keyboards, tablets, and bedside tables, every day. Also, clean any surfaces that may have blood, body fluids, and/or secretions or excretions on them.  Wear gloves when cleaning surfaces the patient has come in contact with.  Use a diluted bleach solution (e.g., dilute bleach with 1  part bleach and 10 parts water) or a household disinfectant with a label that says EPA-registered for coronaviruses. To make a bleach solution at home, add 1 tablespoon of bleach to 1 quart (4 cups) of water. For a larger supply, add  cup of bleach to 1 gallon (16 cups) of water.  Read labels of cleaning products and follow recommendations provided on product labels. Labels contain instructions for safe and effective use of the cleaning product including precautions you should take when applying the product, such as wearing gloves or  eye protection and making sure you have good ventilation during use of the product.  Remove gloves and wash hands immediately after cleaning.  Monitor yourself for signs and symptoms of illness Caregivers and household members are considered close contacts, should monitor their health, and will be asked to limit movement outside of the home to the extent possible. Follow the monitoring steps for close contacts listed on the symptom monitoring form.   ? If you have additional questions, contact your local health department or call the epidemiologist on call at [phone] (available 24/7). ? This guidance is subject to change. For the most up-to-date guidance from [facility], please refer to their website: [email or web address]

## 2019-10-02 NOTE — TOC Transition Note (Signed)
Transition of Care [facility]) - CM/SW Discharge Note   Patient Details  Name: Lynn Alexander MRN: [id number] Date of Birth: 29-Nov-1985  [facility] CM/SW Contact:  Golda Acre, RN Phone Number: 10/02/2019, 10:09 AM   Clinical Narrative:    Patient discharged to return home, orders checked for hhc or dme needs none found.   Final next level of care: Home/Self Care Barriers to Discharge: No Barriers Identified   Patient Goals and CMS Choice        Discharge Placement                       Discharge Plan and Services                                     Social Determinants of Health (SDOH) Interventions     Readmission Risk Interventions No flowsheet data found.

## 2019-10-02 NOTE — Progress Notes (Signed)
Patient currently remains on room air and has been all shift. Her oxygen saturations have remained 92% and above. She did ambulate to the bathroom earlier and became winded during ambulating back to bed. However, her oxygen saturation was 92% on room air when I connected her pulse oximetry back to the monitor. She stated that it was her first time ambulating to the bathroom since being diagnosed with COVID-19. Her vital signs have remained stable and patient currently is in bed asleep.

## 2020-10-12 ENCOUNTER — Encounter: Payer: Self-pay | Admitting: Internal Medicine

## 2020-10-12 ENCOUNTER — Other Ambulatory Visit: Payer: Self-pay

## 2020-10-12 ENCOUNTER — Ambulatory Visit (INDEPENDENT_AMBULATORY_CARE_PROVIDER_SITE_OTHER): Payer: 59 | Admitting: Internal Medicine

## 2020-10-12 ENCOUNTER — Ambulatory Visit: Payer: Self-pay | Admitting: Internal Medicine

## 2020-10-12 ENCOUNTER — Other Ambulatory Visit: Payer: Self-pay | Admitting: *Deleted

## 2020-10-12 VITALS — BP 148/82 | HR 86 | Ht 63.0 in | Wt 338.5 lb

## 2020-10-12 DIAGNOSIS — E662 Morbid (severe) obesity with alveolar hypoventilation: Secondary | ICD-10-CM

## 2020-10-12 DIAGNOSIS — R7401 Elevation of levels of liver transaminase levels: Secondary | ICD-10-CM | POA: Diagnosis not present

## 2020-10-12 DIAGNOSIS — R002 Palpitations: Secondary | ICD-10-CM | POA: Diagnosis not present

## 2020-10-12 DIAGNOSIS — F32A Depression, unspecified: Secondary | ICD-10-CM

## 2020-10-12 DIAGNOSIS — G4734 Idiopathic sleep related nonobstructive alveolar hypoventilation: Secondary | ICD-10-CM

## 2020-10-12 DIAGNOSIS — R0602 Shortness of breath: Secondary | ICD-10-CM

## 2020-10-12 DIAGNOSIS — Z6841 Body Mass Index (BMI) 40.0 and over, adult: Secondary | ICD-10-CM

## 2020-10-12 MED ORDER — ESCITALOPRAM OXALATE 10 MG PO TABS: 10.0000 mg | ORAL_TABLET | Freq: Every day | ORAL | 6 refills | Status: DC

## 2020-10-12 MED ORDER — ALBUTEROL SULFATE HFA 108 (90 BASE) MCG/ACT IN AERS: 1.0000 | INHALATION_SPRAY | RESPIRATORY_TRACT | 1 refills | Status: AC | PRN

## 2020-10-12 NOTE — Progress Notes (Signed)
New Patient Office Visit  Subjective:  Patient ID: Lynn Alexander, female    DOB: May 03, 1986  Age: 35 y.o. MRN: [id number]  CC:  Chief Complaint  Patient presents with  . New Patient (Initial Visit)    HPI Patient presents for shortness of breath  Past Medical History:  Diagnosis Date  . Depression   . Migraines      Current Outpatient Medications:  .  acetaminophen (TYLENOL) 500 MG tablet, Take 500-1,000 mg by mouth every 6 (six) hours as needed for mild pain or fever., Disp: , Rfl:  .  ascorbic acid (VITAMIN C) 500 MG tablet, Take 500 mg by mouth daily., Disp: , Rfl:  .  cholecalciferol (VITAMIN D3) 25 MCG (1000 UNIT) tablet, Take 1,000 Units by mouth daily., Disp: , Rfl:  .  ibuprofen (ADVIL) 200 MG tablet, Take 200-400 mg by mouth every 6 (six) hours as needed for fever or mild pain., Disp: , Rfl:  .  mirtazapine (REMERON) 15 MG tablet, Take 7.5 mg by mouth See admin instructions. Take  tablet (7.5mg ) by mouth at bedtime and repeat dose if needed overnight, Disp: , Rfl:  .  ondansetron (ZOFRAN) 8 MG tablet, Take 8 mg by mouth every 8 (eight) hours as needed for nausea or vomiting., Disp: , Rfl:  .  vitamin B-12 (CYANOCOBALAMIN) 1000 MCG tablet, Take 1,000 mcg by mouth daily., Disp: , Rfl:  .  zinc sulfate 220 (50 Zn) MG capsule, Take 220 mg by mouth daily., Disp: , Rfl:  .  albuterol (VENTOLIN HFA) 108 (90 Base) MCG/ACT inhaler, Inhale 1 puff into the lungs every 4 (four) hours as needed for wheezing or shortness of breath., Disp: 8 g, Rfl: 1 .  escitalopram (LEXAPRO) 10 MG tablet, Take 1 tablet (10 mg total) by mouth at bedtime., Disp: 30 tablet, Rfl: 6   Past Surgical History:  Procedure Laterality Date  . GALLBLADDER SURGERY    . PLACEMENT OF BREAST IMPLANTS      Family History  Problem Relation Age of Onset  . Anxiety disorder Mother   . Stroke Mother   . Anemia Father   . Atrial fibrillation Father   . Hypertension Father   . Anxiety disorder Sister   .  Anxiety disorder Brother   . Anxiety disorder Maternal Grandfather   . Diabetes Neg Hx     Social History   Socioeconomic History  . Marital status: Single    Spouse name: Not on file  . Number of children: Not on file  . Years of education: Not on file  . Highest education level: Not on file  Occupational History  . Not on file  Tobacco Use  . Smoking status: Never Smoker  . Smokeless tobacco: Never Used  Substance and Sexual Activity  . Alcohol use: No  . Drug use: No  . Sexual activity: Not Currently  Other Topics Concern  . Not on file  Social History Narrative  . Not on file   Social Determinants of Health   Financial Resource Strain: Not on file  Food Insecurity: Not on file  Transportation Needs: Not on file  Physical Activity: Not on file  Stress: Not on file  Social Connections: Not on file  Intimate Partner Violence: Not on file    ROS Review of Systems  Constitutional: Positive for diaphoresis and fatigue.  HENT: Negative.  Negative for nosebleeds and rhinorrhea.   Eyes: Positive for photophobia. Negative for pain.  Respiratory: Positive for shortness of  breath. Negative for cough, choking, chest tightness and wheezing.   Cardiovascular: Positive for palpitations. Negative for chest pain and leg swelling.  Gastrointestinal: Negative.  Negative for abdominal distention, abdominal pain, constipation and nausea.  Endocrine: Negative.   Genitourinary: Negative.  Negative for hematuria.  Musculoskeletal: Negative.  Negative for gait problem.  Skin: Negative.   Allergic/Immunologic: Negative.   Neurological: Positive for headaches. Negative for syncope.  Hematological: Negative.  Negative for adenopathy. Does not bruise/bleed easily.  Psychiatric/Behavioral: Positive for sleep disturbance. Negative for agitation, behavioral problems and hallucinations. The patient is nervous/anxious.   All other systems reviewed and are negative.   Objective:   Today's  Vitals: BP (!) 148/82   Pulse 86   Ht 5\' 3"  (1.6 m)   Wt (!) 338 lb 8 oz (153.5 kg)   BMI 59.96 kg/m   Physical Exam Constitutional:      Appearance: She is obese.  HENT:     Head: Normocephalic.     Nose: Nose normal.  Cardiovascular:     Rate and Rhythm: Normal rate.     Pulses: Normal pulses.  Pulmonary:     Effort: Pulmonary effort is normal. No respiratory distress.     Breath sounds: No wheezing, rhonchi or rales.  Abdominal:     General: There is distension.  Musculoskeletal:        General: No swelling.     Cervical back: Neck supple.  Skin:    Coloration: Skin is not jaundiced.  Neurological:     General: No focal deficit present.     Mental Status: She is alert.  Psychiatric:        Mood and Affect: Mood normal.        Behavior: Behavior normal.        Thought Content: Thought content normal.     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  Also get a chest x-ray done.  Lab tests were ordered.      Class 3 obesity with alveolar hypoventilation, serious comorbidity, and body mass index (BMI) of 50.0 to 59.9 in adult [facility])    - I encouraged the patient to lose weight.  - I educated them on making healthy dietary choices including eating more fruits and vegetables and less fried foods. - I encouraged the patient to exercise more, and educated on the benefits of exercise including weight loss, diabetes prevention, and hypertension prevention.          Other   PALPITATIONS    I advised the patient to lose weight.  She was given a [facility].      Depression    I encouraged the patient to see her psychiatrist on a regular basis.      Elevated transaminase level    We will check the liver function test today with CBC and metabolic panel.       Other Visit  Diagnoses    Shortness of breath       Relevant Medications   albuterol (VENTOLIN HFA) 108 (90 Base) MCG/ACT inhaler      Outpatient Encounter Medications as of 10/12/2020  Medication Sig  . acetaminophen (TYLENOL) 500 MG tablet Take 500-1,000 mg by mouth every 6 (six) hours as needed for mild pain or fever.  12/12/2020  ascorbic acid (VITAMIN C) 500 MG tablet Take 500 mg by mouth daily.  . cholecalciferol (VITAMIN D3) 25 MCG (1000 UNIT) tablet Take 1,000 Units by mouth daily.  Marland Kitchen ibuprofen (ADVIL) 200 MG tablet Take 200-400 mg by mouth every 6 (six) hours as needed for fever or mild pain.  . mirtazapine (REMERON) 15 MG tablet Take 7.5 mg by mouth See admin instructions. Take  tablet (7.5mg ) by mouth at bedtime and repeat dose if needed overnight  . ondansetron (ZOFRAN) 8 MG tablet Take 8 mg by mouth every 8 (eight) hours as needed for nausea or vomiting.  . vitamin B-12 (CYANOCOBALAMIN) 1000 MCG tablet Take 1,000 mcg by mouth daily.  Marland Kitchen zinc sulfate 220 (50 Zn) MG capsule Take 220 mg by mouth daily.  . [DISCONTINUED] albuterol (VENTOLIN HFA) 108 (90 Base) MCG/ACT inhaler Inhale 1 puff into the lungs every 4 (four) hours as needed for wheezing or shortness of breath.  . [DISCONTINUED] escitalopram (LEXAPRO) 10 MG tablet Take 10 mg by mouth at bedtime.  Marland Kitchen albuterol (VENTOLIN HFA) 108 (90 Base) MCG/ACT inhaler Inhale 1 puff into the lungs every 4 (four) hours as needed for wheezing or shortness of breath.   No facility-administered encounter medications on file as of 10/12/2020.    Follow-up: No follow-ups on file.   Corky Downs, MD

## 2020-10-12 NOTE — Assessment & Plan Note (Signed)
-   I encouraged the patient to lose weight.  - I educated them on making healthy dietary choices including eating more fruits and vegetables and less fried foods. - I encouraged the patient to exercise more, and educated on the benefits of exercise including weight loss, diabetes prevention, and hypertension prevention.   

## 2020-10-12 NOTE — Assessment & Plan Note (Signed)
Patient was advised to lose weight we will schedule her for a sleep study.  Also get a chest x-ray done.  Lab tests were ordered.

## 2020-10-12 NOTE — Assessment & Plan Note (Signed)
I encouraged the patient to see her psychiatrist on a regular basis.

## 2020-10-12 NOTE — Assessment & Plan Note (Signed)
We will check the liver function test today with CBC and metabolic panel.

## 2020-10-12 NOTE — Assessment & Plan Note (Signed)
I advised the patient to lose weight.  She was given a [employer].

## 2020-10-22 IMAGING — DX DG CHEST 1V PORT
1 series · 1 of 1 positions shown · non-contrast
Comparison: None.

CLINICAL DATA: [id number] positive with hypoxia

EXAM:
PORTABLE CHEST 1 VIEW

[chest ap]
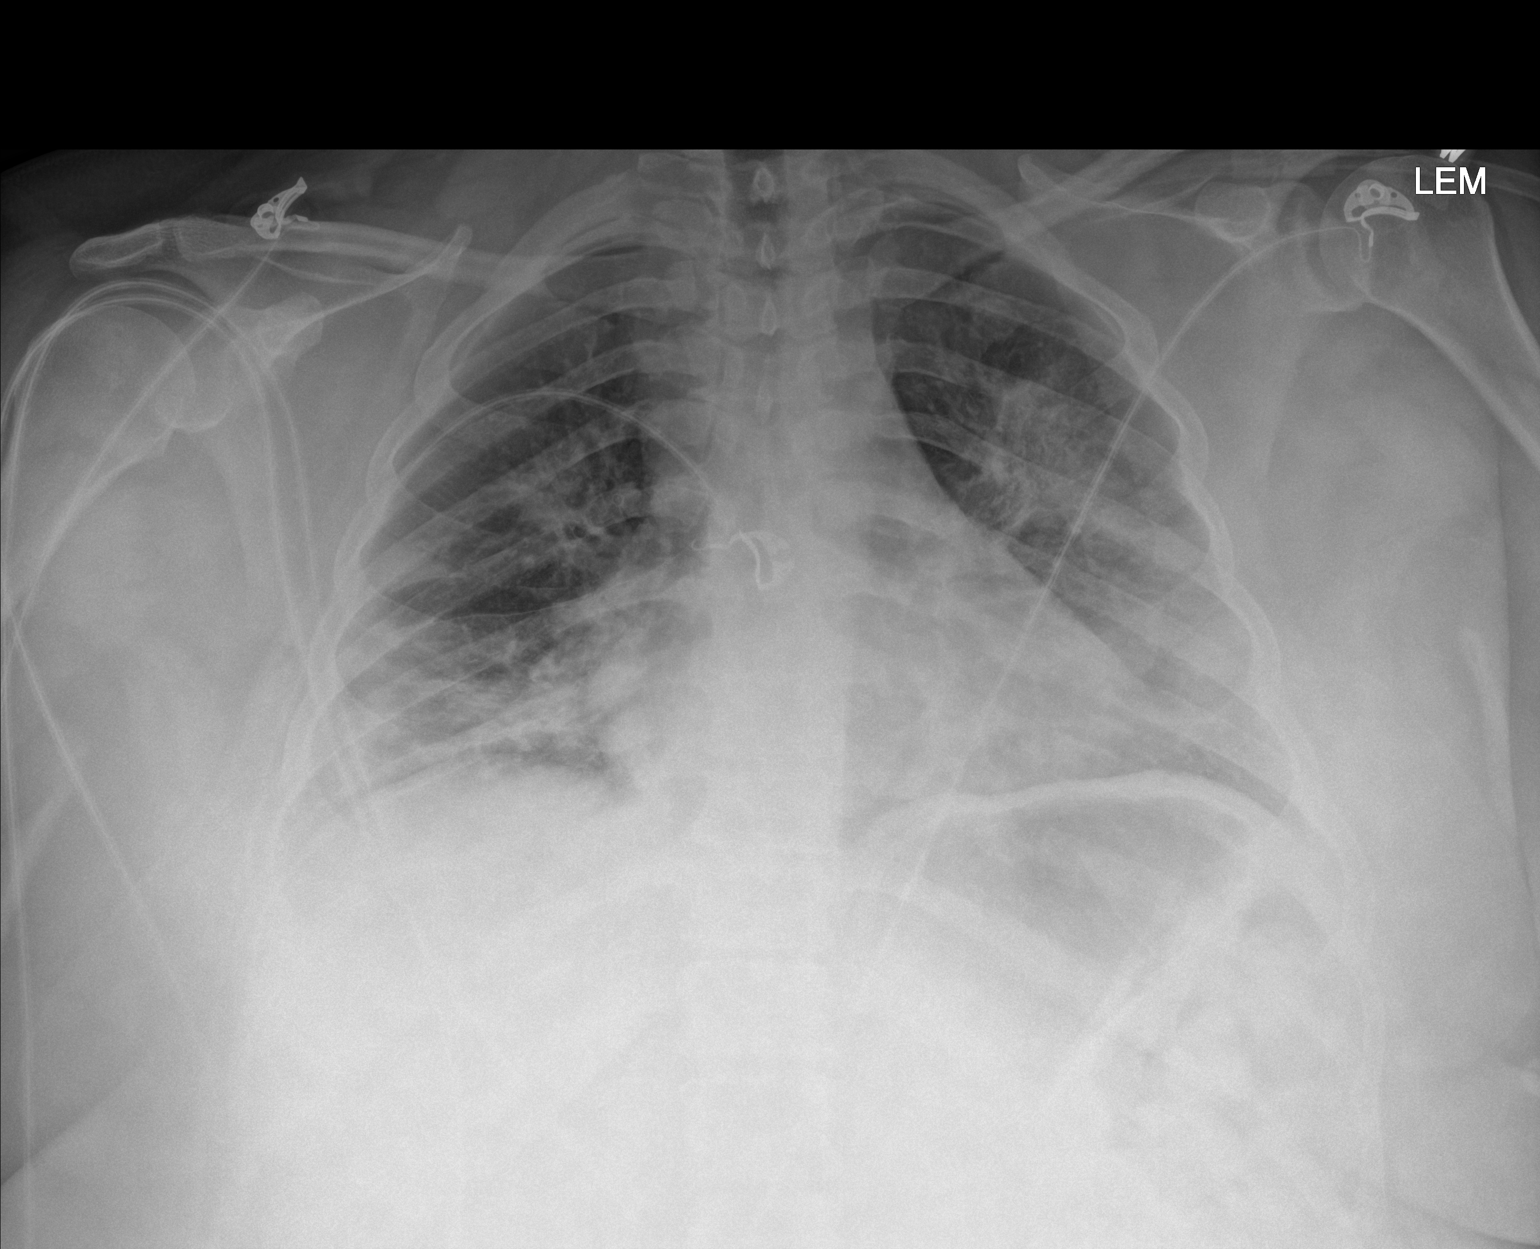

[1 of 1 positions shown; findings below may reference images not displayed]

FINDINGS: There is airspace opacity consistent with pneumonia in the left
upper lobe, right base, and to a lesser extent in the right upper
lobe. Heart size and pulmonary vascularity are normal. No
adenopathy. No bone lesions.
IMPRESSION: Multifocal pneumonia with greatest degree of airspace opacity in the
left upper lobe and right base regions. Cardiac silhouette normal.
No adenopathy.

## 2020-10-24 IMAGING — DX DG CHEST 2V
1 series · 1 of 1 positions shown · non-contrast
Comparison: Chest radiograph 07/14/2020

CLINICAL DATA: COVID positive hypoxia

EXAM:
CHEST - 2 VIEW

[chest]
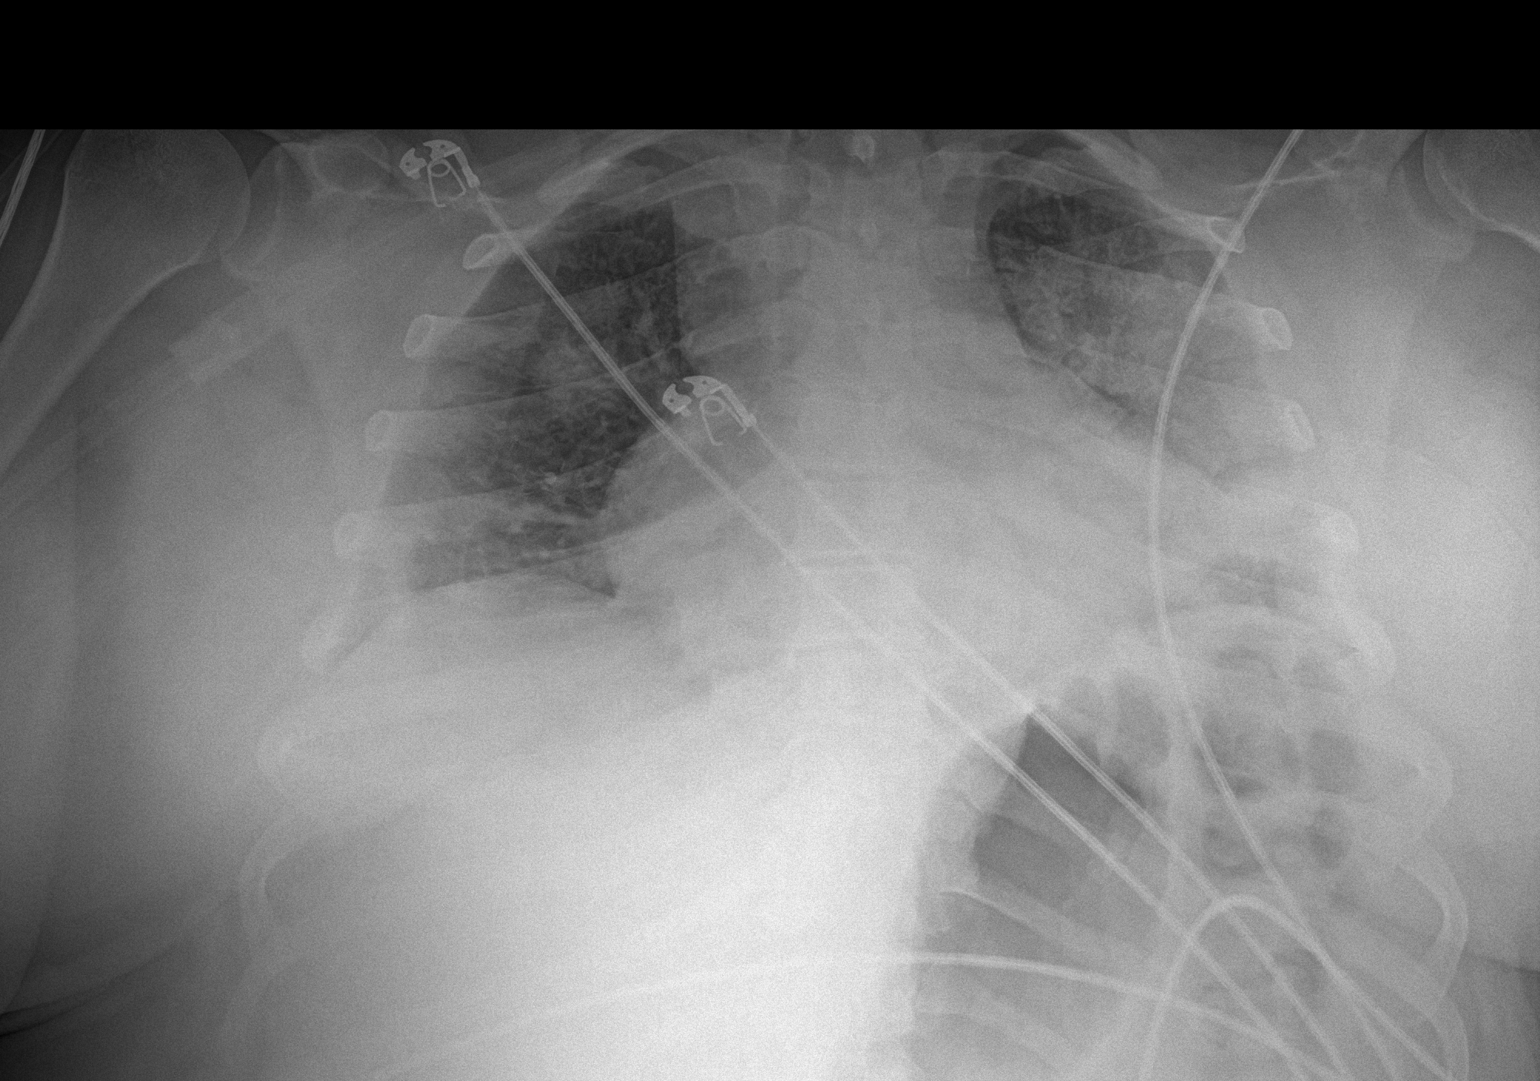

[1 of 1 positions shown; findings below may reference images not displayed]

FINDINGS: Stable cardiomediastinal contours given AP technique and low
volumes. Airspace opacities in the left upper lung appear increased
from prior. Persistent airspace opacities in the right upper and
right lower lung. No evidence of pneumothorax or significant pleural
effusion.
IMPRESSION: Worsening airspace opacities in the left upper lung. Persistent
airspace opacities in the right upper and right lower lung.

## 2020-10-29 IMAGING — US US ABDOMEN LIMITED
1 series · 14 of 25 positions shown · non-contrast
Comparison: Ultrasound 04/21/2005.

CLINICAL DATA: Elevated LFTs.

EXAM:
ULTRASOUND ABDOMEN LIMITED RIGHT UPPER QUADRANT

[Series 1: us abdomen limited · 0.28mm/px · 14 of 26 slices shown]
[im 1/26]
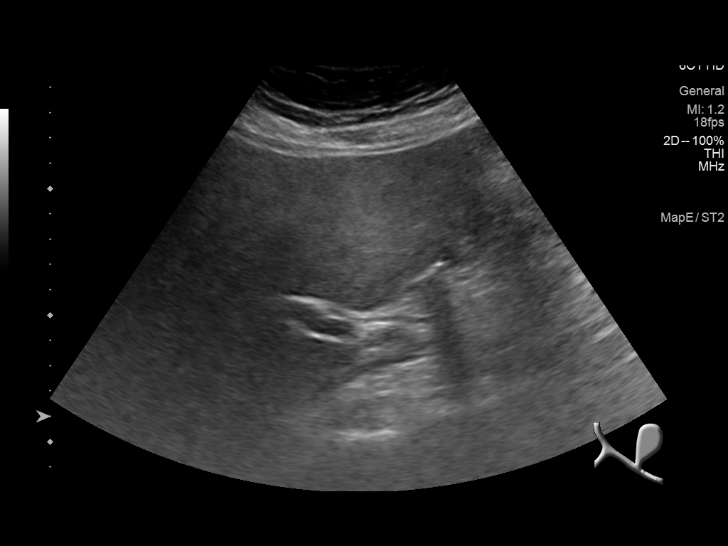
[im 3/26]
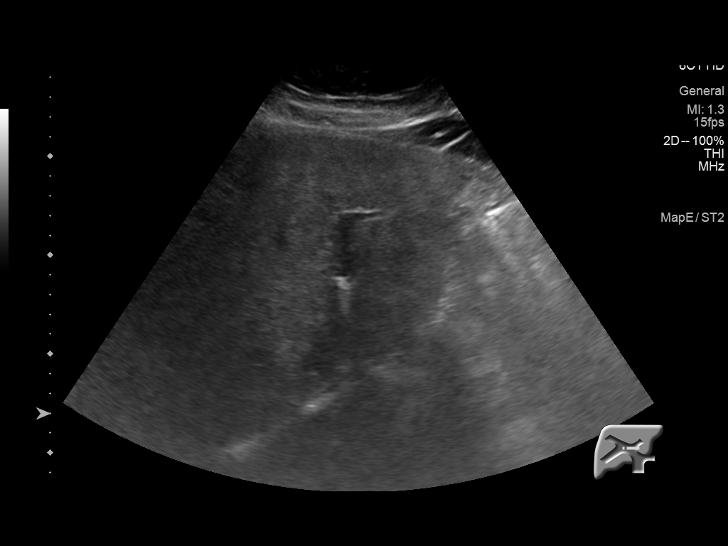
[im 5/26]
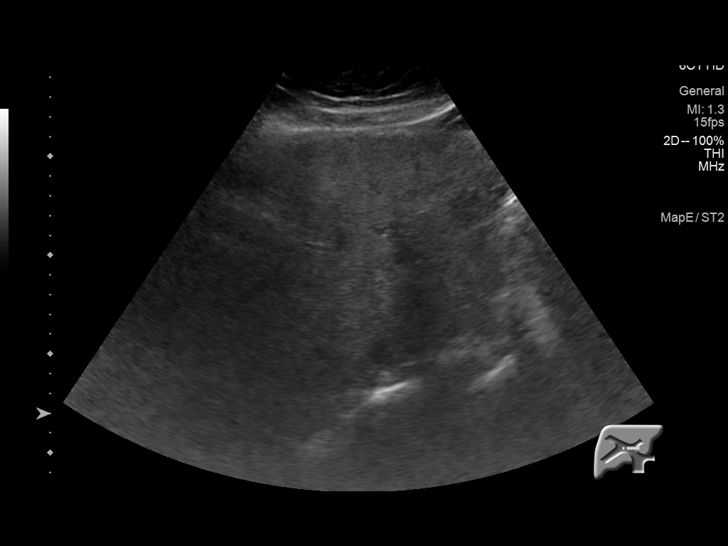
[im 7/26]
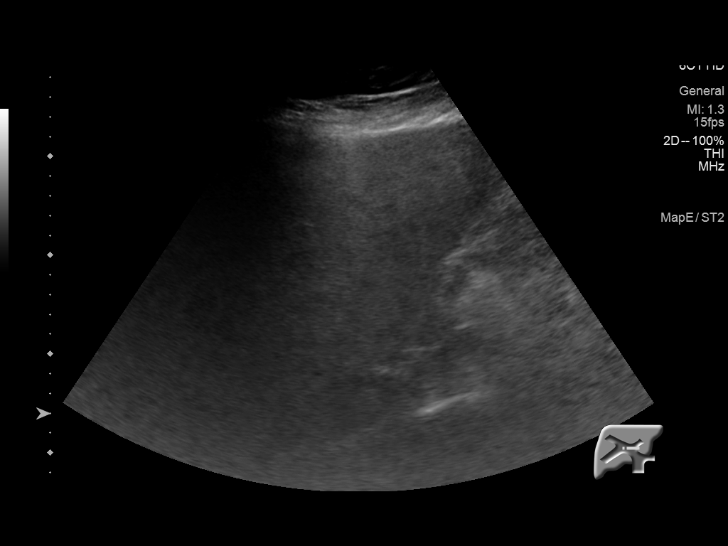
[im 9/26]
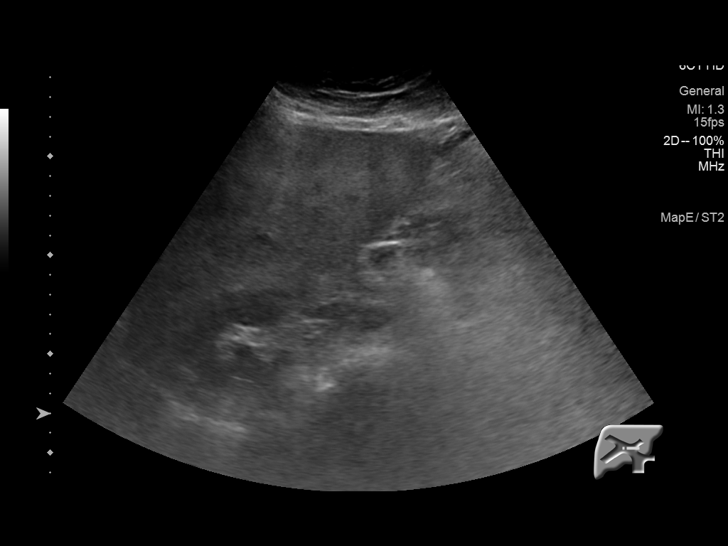
[im 10/26]
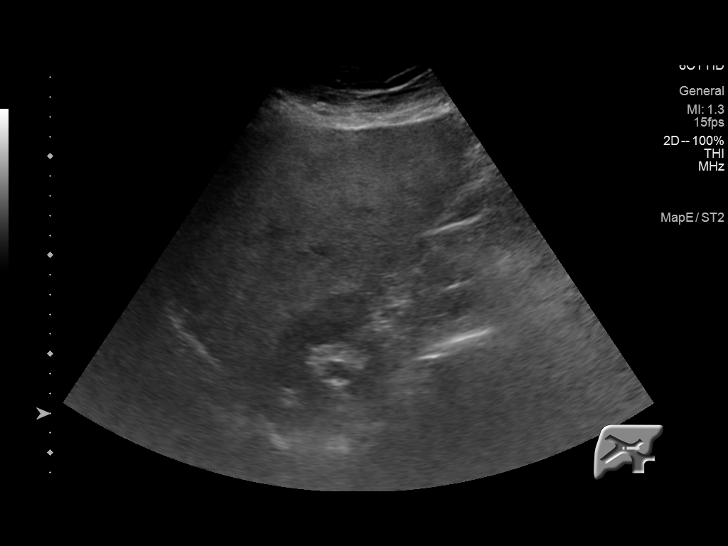
[im 12/26]
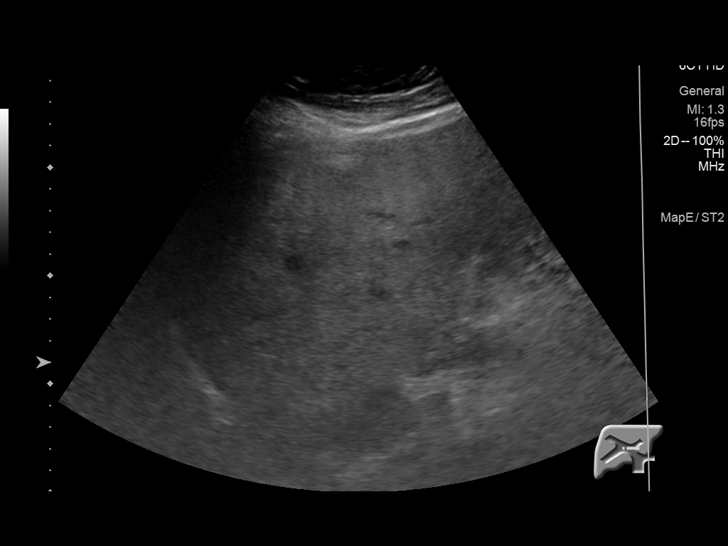
[im 14/26]
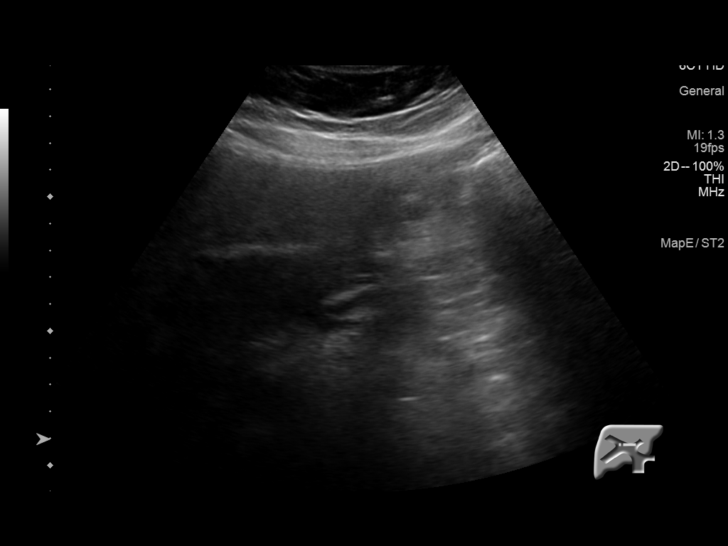
[im 16/26]
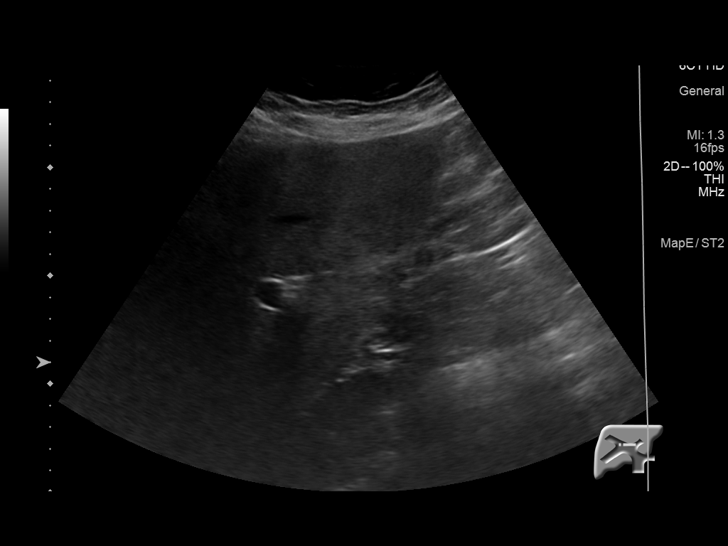
[im 17/26]
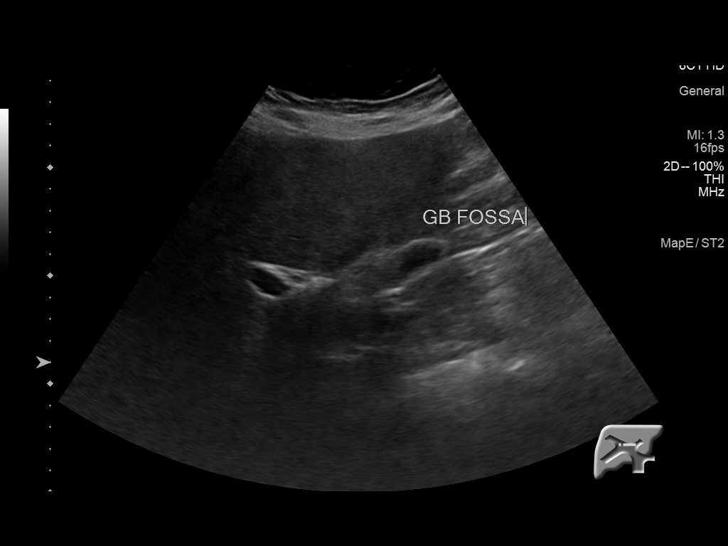
[im 19/26]
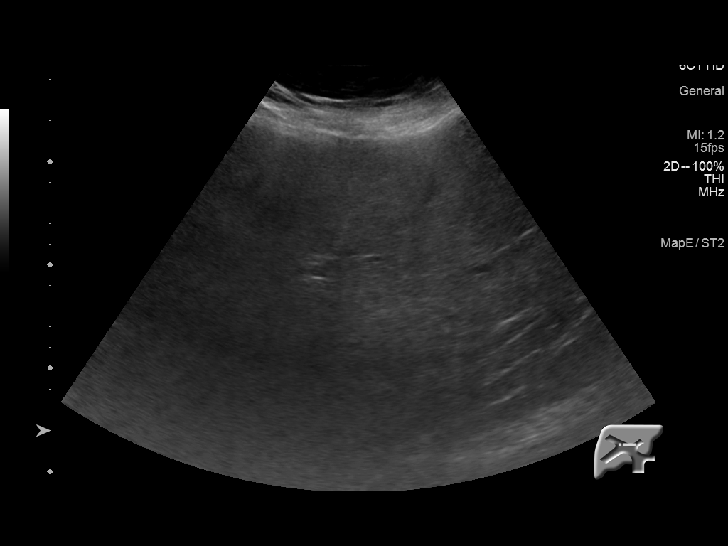
[im 21/26]
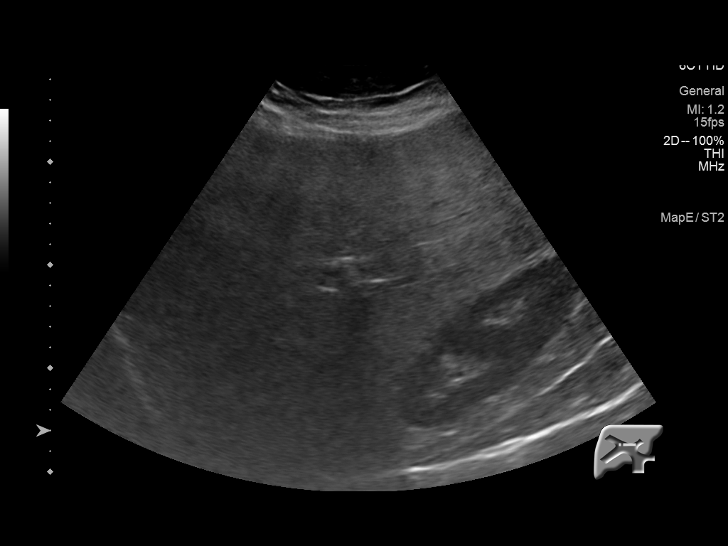
[im 23/26]
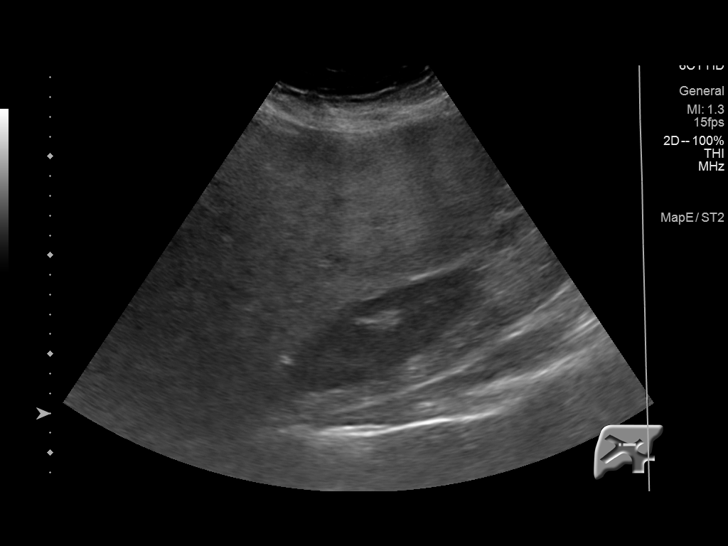
[im 26/26]
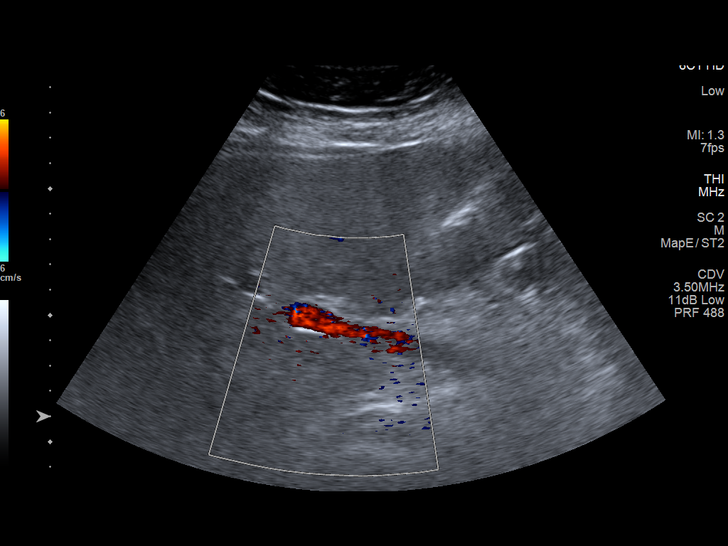

[14 of 25 positions shown; findings below may reference images not displayed]

FINDINGS: Gallbladder:

Cholecystectomy.

Common bile duct:

Diameter: 3 mm

Liver:

Increased echogenicity consistent with fatty infiltration and/or
hepatocellular disease. Portal vein is patent on color Doppler
imaging with normal direction of blood flow towards the liver.

Other: None.
IMPRESSION: 1.  Prior cholecystectomy.  No biliary distention.

2. Increased hepatic echogenicity consistent fatty infiltration
and/or hepatocellular disease.

## 2020-11-16 ENCOUNTER — Other Ambulatory Visit: Payer: Self-pay | Admitting: Internal Medicine

## 2024-08-28 ENCOUNTER — Other Ambulatory Visit: Payer: Self-pay | Admitting: Otolaryngology

## 2024-08-28 DIAGNOSIS — R221 Localized swelling, mass and lump, neck: Secondary | ICD-10-CM

## 2024-09-02 ENCOUNTER — Ambulatory Visit
Admission: RE | Admit: 2024-09-02 | Discharge: 2024-09-02 | Disposition: A | Payer: Self-pay | Source: Ambulatory Visit | Attending: Otolaryngology | Admitting: Otolaryngology

## 2024-09-02 DIAGNOSIS — R221 Localized swelling, mass and lump, neck: Secondary | ICD-10-CM

## 2024-09-02 MED ORDER — IOPAMIDOL (ISOVUE-300) INJECTION 61%
75.0000 mL | Freq: Once | INTRAVENOUS | Status: AC | PRN
  Administered 2024-09-02: 75 mL via INTRAVENOUS
